# Patient Record
Sex: Male | Born: 1990 | Race: White | Hispanic: No | Smoking: Current some day smoker
Health system: Southern US, Community
[De-identification: ages and names within clinical notes are randomized; demographics above are authoritative.]

## PROBLEM LIST (undated history)

## (undated) DIAGNOSIS — E785 Hyperlipidemia, unspecified: Secondary | ICD-10-CM

## (undated) DIAGNOSIS — F419 Anxiety disorder, unspecified: Secondary | ICD-10-CM

## (undated) DIAGNOSIS — K219 Gastro-esophageal reflux disease without esophagitis: Secondary | ICD-10-CM

## (undated) DIAGNOSIS — I639 Cerebral infarction, unspecified: Secondary | ICD-10-CM

## (undated) HISTORY — PX: NO PAST SURGERIES: SHX2092

---
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
## 2010-04-17 ENCOUNTER — Emergency Department (HOSPITAL_COMMUNITY): Admission: EM | Admit: 2010-04-17 | Discharge: 2010-04-18 | Payer: Self-pay | Admitting: Emergency Medicine

## 2011-06-30 ENCOUNTER — Emergency Department: Payer: Self-pay | Admitting: Internal Medicine

## 2013-09-21 ENCOUNTER — Emergency Department: Payer: Self-pay | Admitting: Emergency Medicine

## 2013-09-21 HISTORY — PX: COLONOSCOPY: SHX174

## 2013-09-21 LAB — TROPONIN I

## 2013-09-21 LAB — COMPREHENSIVE METABOLIC PANEL
ALK PHOS: 92 U/L
ANION GAP: 4 — AB (ref 7–16)
Albumin: 3.7 g/dL (ref 3.4–5.0)
BILIRUBIN TOTAL: 0.5 mg/dL (ref 0.2–1.0)
BUN: 7 mg/dL (ref 7–18)
CALCIUM: 9 mg/dL (ref 8.5–10.1)
CO2: 27 mmol/L (ref 21–32)
CREATININE: 0.77 mg/dL (ref 0.60–1.30)
Chloride: 107 mmol/L (ref 98–107)
Glucose: 110 mg/dL — ABNORMAL HIGH (ref 65–99)
OSMOLALITY: 274 (ref 275–301)
POTASSIUM: 3.7 mmol/L (ref 3.5–5.1)
SGOT(AST): 25 U/L (ref 15–37)
SGPT (ALT): 55 U/L (ref 12–78)
Sodium: 138 mmol/L (ref 136–145)
Total Protein: 7.1 g/dL (ref 6.4–8.2)

## 2013-09-21 LAB — CBC
HCT: 46.6 % (ref 40.0–52.0)
HGB: 16 g/dL (ref 13.0–18.0)
MCH: 28 pg (ref 26.0–34.0)
MCHC: 34.4 g/dL (ref 32.0–36.0)
MCV: 81 fL (ref 80–100)
Platelet: 229 10*3/uL (ref 150–440)
RBC: 5.72 10*6/uL (ref 4.40–5.90)
RDW: 12.7 % (ref 11.5–14.5)
WBC: 11.9 10*3/uL — AB (ref 3.8–10.6)

## 2013-09-21 LAB — LIPASE, BLOOD: Lipase: 78 U/L (ref 73–393)

## 2013-09-23 ENCOUNTER — Emergency Department: Payer: Self-pay | Admitting: Emergency Medicine

## 2013-09-23 LAB — COMPREHENSIVE METABOLIC PANEL
ALBUMIN: 3.9 g/dL (ref 3.4–5.0)
ALK PHOS: 96 U/L
ANION GAP: 2 — AB (ref 7–16)
BUN: 11 mg/dL (ref 7–18)
Bilirubin,Total: 0.3 mg/dL (ref 0.2–1.0)
CO2: 31 mmol/L (ref 21–32)
Calcium, Total: 9.2 mg/dL (ref 8.5–10.1)
Chloride: 105 mmol/L (ref 98–107)
Creatinine: 0.83 mg/dL (ref 0.60–1.30)
EGFR (African American): >60
GLUCOSE: 97 mg/dL (ref 65–99)
OSMOLALITY: 275 (ref 275–301)
POTASSIUM: 4.4 mmol/L (ref 3.5–5.1)
SGOT(AST): 29 U/L (ref 15–37)
SGPT (ALT): 46 U/L (ref 12–78)
Sodium: 138 mmol/L (ref 136–145)
Total Protein: 7.7 g/dL (ref 6.4–8.2)

## 2013-09-23 LAB — CBC WITH DIFFERENTIAL/PLATELET
BASOS ABS: 0.1 10*3/uL (ref 0.0–0.1)
BASOS PCT: 1.3 %
EOS ABS: 0.2 10*3/uL (ref 0.0–0.7)
EOS PCT: 2.3 %
HCT: 47.2 % (ref 40.0–52.0)
HGB: 16.4 g/dL (ref 13.0–18.0)
LYMPHS ABS: 2.3 10*3/uL (ref 1.0–3.6)
LYMPHS PCT: 26.9 %
MCH: 28.4 pg (ref 26.0–34.0)
MCHC: 34.8 g/dL (ref 32.0–36.0)
MCV: 82 fL (ref 80–100)
Monocyte #: 0.9 x10 3/mm (ref 0.2–1.0)
Monocyte %: 10.1 %
NEUTROS ABS: 5.1 10*3/uL (ref 1.4–6.5)
NEUTROS PCT: 59.4 %
PLATELETS: 258 10*3/uL (ref 150–440)
RBC: 5.79 10*6/uL (ref 4.40–5.90)
RDW: 13 % (ref 11.5–14.5)
WBC: 8.6 10*3/uL (ref 3.8–10.6)

## 2013-09-23 LAB — URINALYSIS, COMPLETE
BILIRUBIN, UR: NEGATIVE
BLOOD: NEGATIVE
Bacteria: NONE SEEN
GLUCOSE, UR: NEGATIVE mg/dL (ref 0–75)
Ketone: NEGATIVE
Leukocyte Esterase: NEGATIVE
Nitrite: NEGATIVE
PH: 7 (ref 4.5–8.0)
PROTEIN: NEGATIVE
Specific Gravity: 1.015 (ref 1.003–1.030)
Squamous Epithelial: <1
WBC UR: 1 /HPF (ref 0–5)

## 2013-09-26 ENCOUNTER — Other Ambulatory Visit: Payer: Self-pay | Admitting: Urgent Care

## 2013-09-26 LAB — COMPREHENSIVE METABOLIC PANEL
ANION GAP: 3 — AB (ref 7–16)
Albumin: 4.1 g/dL (ref 3.4–5.0)
Alkaline Phosphatase: 104 U/L
BUN: 14 mg/dL (ref 7–18)
Bilirubin,Total: 0.4 mg/dL (ref 0.2–1.0)
CALCIUM: 9.8 mg/dL (ref 8.5–10.1)
Chloride: 104 mmol/L (ref 98–107)
Co2: 28 mmol/L (ref 21–32)
Creatinine: 0.92 mg/dL (ref 0.60–1.30)
EGFR (Non-African Amer.): >60
GLUCOSE: 87 mg/dL (ref 65–99)
OSMOLALITY: 270 (ref 275–301)
POTASSIUM: 4.3 mmol/L (ref 3.5–5.1)
SGOT(AST): 28 U/L (ref 15–37)
SGPT (ALT): 47 U/L (ref 12–78)
SODIUM: 135 mmol/L — AB (ref 136–145)
TOTAL PROTEIN: 8 g/dL (ref 6.4–8.2)

## 2013-09-26 LAB — CBC WITH DIFFERENTIAL/PLATELET
BASOS ABS: 0.1 10*3/uL (ref 0.0–0.1)
BASOS PCT: 1.3 %
Eosinophil #: 0.1 10*3/uL (ref 0.0–0.7)
Eosinophil %: 1.3 %
HCT: 48.7 % (ref 40.0–52.0)
HGB: 16.6 g/dL (ref 13.0–18.0)
LYMPHS ABS: 1.7 10*3/uL (ref 1.0–3.6)
Lymphocyte %: 21.7 %
MCH: 27.4 pg (ref 26.0–34.0)
MCHC: 34 g/dL (ref 32.0–36.0)
MCV: 81 fL (ref 80–100)
MONO ABS: 0.6 x10 3/mm (ref 0.2–1.0)
Monocyte %: 8 %
NEUTROS ABS: 5.4 10*3/uL (ref 1.4–6.5)
NEUTROS PCT: 67.7 %
Platelet: 274 10*3/uL (ref 150–440)
RBC: 6.05 10*6/uL — AB (ref 4.40–5.90)
RDW: 12.8 % (ref 11.5–14.5)
WBC: 8 10*3/uL (ref 3.8–10.6)

## 2013-09-26 LAB — LIPASE, BLOOD: Lipase: 71 U/L — ABNORMAL LOW (ref 73–393)

## 2014-03-30 ENCOUNTER — Emergency Department: Payer: Self-pay | Admitting: Emergency Medicine

## 2014-03-30 LAB — COMPREHENSIVE METABOLIC PANEL
ALK PHOS: 87 U/L
AST: 30 U/L (ref 15–37)
Albumin: 4.2 g/dL (ref 3.4–5.0)
Anion Gap: 8 (ref 7–16)
BILIRUBIN TOTAL: 0.5 mg/dL (ref 0.2–1.0)
BUN: 15 mg/dL (ref 7–18)
CALCIUM: 8.8 mg/dL (ref 8.5–10.1)
CREATININE: 0.7 mg/dL (ref 0.60–1.30)
Chloride: 110 mmol/L — ABNORMAL HIGH (ref 98–107)
Co2: 25 mmol/L (ref 21–32)
EGFR (African American): >60
GLUCOSE: 117 mg/dL — AB (ref 65–99)
Osmolality: 287 (ref 275–301)
POTASSIUM: 3.9 mmol/L (ref 3.5–5.1)
SGPT (ALT): 35 U/L (ref 12–78)
Sodium: 143 mmol/L (ref 136–145)
TOTAL PROTEIN: 7.3 g/dL (ref 6.4–8.2)

## 2014-03-30 LAB — CBC WITH DIFFERENTIAL/PLATELET
BASOS PCT: 1.1 %
Basophil #: 0.1 10*3/uL (ref 0.0–0.1)
EOS ABS: 0.1 10*3/uL (ref 0.0–0.7)
EOS PCT: 2.1 %
HCT: 46.5 % (ref 40.0–52.0)
HGB: 15.9 g/dL (ref 13.0–18.0)
LYMPHS ABS: 2.1 10*3/uL (ref 1.0–3.6)
Lymphocyte %: 32 %
MCH: 28.3 pg (ref 26.0–34.0)
MCHC: 34.1 g/dL (ref 32.0–36.0)
MCV: 83 fL (ref 80–100)
MONOS PCT: 7.5 %
Monocyte #: 0.5 x10 3/mm (ref 0.2–1.0)
Neutrophil #: 3.8 10*3/uL (ref 1.4–6.5)
Neutrophil %: 57.3 %
Platelet: 222 10*3/uL (ref 150–440)
RBC: 5.61 10*6/uL (ref 4.40–5.90)
RDW: 13 % (ref 11.5–14.5)
WBC: 6.7 10*3/uL (ref 3.8–10.6)

## 2014-03-30 LAB — LIPASE, BLOOD: Lipase: 91 U/L (ref 73–393)

## 2014-03-30 LAB — URINALYSIS, COMPLETE
Bilirubin,UR: NEGATIVE
Blood: NEGATIVE
Glucose,UR: NEGATIVE mg/dL (ref 0–75)
KETONE: NEGATIVE
LEUKOCYTE ESTERASE: NEGATIVE
Nitrite: NEGATIVE
PH: 8 (ref 4.5–8.0)
RBC,UR: <1 /HPF (ref 0–5)
Specific Gravity: 1.025 (ref 1.003–1.030)
Squamous Epithelial: NONE SEEN
WBC UR: <1 /HPF (ref 0–5)

## 2014-04-22 DIAGNOSIS — R109 Unspecified abdominal pain: Secondary | ICD-10-CM | POA: Insufficient documentation

## 2014-04-22 DIAGNOSIS — R197 Diarrhea, unspecified: Secondary | ICD-10-CM | POA: Insufficient documentation

## 2014-06-15 ENCOUNTER — Emergency Department: Payer: Self-pay | Admitting: Emergency Medicine

## 2014-06-28 ENCOUNTER — Emergency Department: Payer: Self-pay | Admitting: Emergency Medicine

## 2015-11-01 ENCOUNTER — Encounter: Payer: Self-pay | Admitting: Emergency Medicine

## 2015-11-01 ENCOUNTER — Emergency Department: Payer: Managed Care, Other (non HMO)

## 2015-11-01 ENCOUNTER — Emergency Department
Admission: EM | Admit: 2015-11-01 | Discharge: 2015-11-01 | Disposition: A | Discharge status: Home / Self Care | Payer: Managed Care, Other (non HMO) | Attending: Student | Admitting: Student

## 2015-11-01 DIAGNOSIS — F17200 Nicotine dependence, unspecified, uncomplicated: Secondary | ICD-10-CM | POA: Diagnosis not present

## 2015-11-01 DIAGNOSIS — M25511 Pain in right shoulder: Secondary | ICD-10-CM | POA: Diagnosis not present

## 2015-11-01 DIAGNOSIS — M25512 Pain in left shoulder: Secondary | ICD-10-CM | POA: Insufficient documentation

## 2015-11-01 DIAGNOSIS — M542 Cervicalgia: Secondary | ICD-10-CM | POA: Insufficient documentation

## 2015-11-01 DIAGNOSIS — R5383 Other fatigue: Secondary | ICD-10-CM | POA: Insufficient documentation

## 2015-11-01 DIAGNOSIS — R079 Chest pain, unspecified: Secondary | ICD-10-CM | POA: Insufficient documentation

## 2015-11-01 DIAGNOSIS — M62838 Other muscle spasm: Secondary | ICD-10-CM | POA: Diagnosis not present

## 2015-11-01 DIAGNOSIS — M546 Pain in thoracic spine: Secondary | ICD-10-CM | POA: Diagnosis present

## 2015-11-01 DIAGNOSIS — M7918 Myalgia, other site: Secondary | ICD-10-CM

## 2015-11-01 DIAGNOSIS — R05 Cough: Secondary | ICD-10-CM | POA: Insufficient documentation

## 2015-11-01 MED ORDER — CYCLOBENZAPRINE HCL 10 MG PO TABS: 10.0000 mg | ORAL_TABLET | Freq: Three times a day (TID) | ORAL | Status: DC | PRN

## 2015-11-01 MED ORDER — IBUPROFEN 600 MG PO TABS: 600.0000 mg | ORAL_TABLET | Freq: Four times a day (QID) | ORAL | Status: DC | PRN

## 2015-11-01 NOTE — ED Notes (Signed)
Reports cough, back and chest pain when coughing.  NAD

## 2015-11-01 NOTE — Discharge Instructions (Signed)
Musculoskeletal Pain °Musculoskeletal pain is muscle and boney aches and pains. These pains can occur in any part of the body. Your caregiver may treat you without knowing the cause of the pain. They may treat you if blood or urine tests, X-rays, and other tests were normal.  °CAUSES °There is often not a definite cause or reason for these pains. These pains may be caused by a type of germ (virus). The discomfort may also come from overuse. Overuse includes working out too hard when your body is not fit. Boney aches also come from weather changes. Bone is sensitive to atmospheric pressure changes. °HOME CARE INSTRUCTIONS  °· Ask when your test results will be ready. Make sure you get your test results. °· Only take over-the-counter or prescription medicines for pain, discomfort, or fever as directed by your caregiver. If you were given medications for your condition, do not drive, operate machinery or power tools, or sign legal documents for 24 hours. Do not drink alcohol. Do not take sleeping pills or other medications that may interfere with treatment. °· Continue all activities unless the activities cause more pain. When the pain lessens, slowly resume normal activities. Gradually increase the intensity and duration of the activities or exercise. °· During periods of severe pain, bed rest may be helpful. Lay or sit in any position that is comfortable. °· Putting ice on the injured area. °¨ Put ice in a bag. °¨ Place a towel between your skin and the bag. °¨ Leave the ice on for 15 to 20 minutes, 3 to 4 times a day. °· Follow up with your caregiver for continued problems and no reason can be found for the pain. If the pain becomes worse or does not go away, it may be necessary to repeat tests or do additional testing. Your caregiver may need to look further for a possible cause. °SEEK IMMEDIATE MEDICAL CARE IF: °· You have pain that is getting worse and is not relieved by medications. °· You develop chest pain  that is associated with shortness or breath, sweating, feeling sick to your stomach (nauseous), or throw up (vomit). °· Your pain becomes localized to the abdomen. °· You develop any new symptoms that seem different or that concern you. °MAKE SURE YOU:  °· Understand these instructions. °· Will watch your condition. °· Will get help right away if you are not doing well or get worse. °  °This information is not intended to replace advice given to you by your health care provider. Make sure you discuss any questions you have with your health care provider. °  °Document Released: 09/07/2005 Document Revised: 11/30/2011 Document Reviewed: 05/12/2013 °Elsevier Interactive Patient Education ©2016 [employer] ° °Muscle Cramps and Spasms °Muscle cramps and spasms are when muscles tighten by themselves. They usually get better within minutes. Muscle cramps are painful. They are usually stronger and last longer than muscle spasms. Muscle spasms may or may not be painful. They can last a few seconds or much longer. °HOME CARE °· Drink enough fluid to keep your pee (urine) clear or pale yellow. °· Massage, stretch, and relax the muscle. °· Use a warm towel, heating pad, or warm shower water on tight muscles. °· Place ice on the muscle if it is tender or in pain. °¨ Put ice in a plastic bag. °¨ Place a towel between your skin and the bag. °¨ Leave the ice on for 15-20 minutes, 03-04 times a day. °· Only take medicine as told by your   doctor. °GET HELP RIGHT AWAY IF:  °Your cramps or spasms get worse, happen more often, or do not get better with time. °MAKE SURE YOU: °· Understand these instructions. °· Will watch your condition. °· Will get help right away if you are not doing well or get worse. °  °This information is not intended to replace advice given to you by your health care provider. Make sure you discuss any questions you have with your health care provider. °  °Document Released: 08/20/2008 Document Revised:  01/02/2013 Document Reviewed: 08/24/2012 °Elsevier Interactive Patient Education ©2016 Elsevier Inc. ° °

## 2015-11-01 NOTE — ED Provider Notes (Signed)
[facility] Emergency Department Provider Note  ____________________________________________  Time seen: Approximately 11:08 AM  I have reviewed the triage vital signs and the nursing notes.   HISTORY  Chief Complaint Back Pain    HPI Luke Conway. is a 25 y.o. male , NAD, presents to emergency department with one-day history of upper back pain and chest pain. Notes he is a chronic smoker and coughs daily. Has had some fatigue recently. Denies shortness of breath or wheezing. No fevers, chills, myalgias. No known sick contacts. No changes in physical activity.   History reviewed. No pertinent past medical history.  There are no active problems to display for this patient.   History reviewed. No pertinent past surgical history.  Current Outpatient Rx  Name  Route  Sig  Dispense  Refill  . cyclobenzaprine (FLEXERIL) 10 MG tablet   Oral   Take 1 tablet (10 mg total) by mouth 3 (three) times daily as needed for muscle spasms.   21 tablet   0   . ibuprofen (ADVIL,MOTRIN) 600 MG tablet   Oral   Take 1 tablet (600 mg total) by mouth every 6 (six) hours as needed.   30 tablet   0     Allergies Review of patient's allergies indicates no known allergies.  History reviewed. No pertinent family history.  Social History Social History  Substance Use Topics  . Smoking status: Current Every Day Smoker  . Smokeless tobacco: None  . Alcohol Use: None     Review of Systems  Constitutional: Positive fatigue. No fever/chills Eyes: No visual changes.  ENT: No sore throat, nasal congestion, ear pain. Cardiovascular: Positive chest pain. Respiratory: No cough. No shortness of breath. No wheezing.  Gastrointestinal: No abdominal pain.  No nausea, vomiting.   Musculoskeletal: Positive for neck and shoulder pain.   Skin: Negative for rash. Neurological: Negative for headaches, focal weakness or numbness. 10-point ROS otherwise  negative.  ____________________________________________   PHYSICAL EXAM:  VITAL SIGNS: ED Triage Vitals  Enc Vitals Group     BP 11/01/15 1021 107/94 mmHg     Pulse Rate 11/01/15 1021 62     Resp 11/01/15 1021 20     Temp 11/01/15 1021 97.4 F (36.3 C)     Temp Source 11/01/15 1021 Oral     SpO2 11/01/15 1021 99 %     Weight 11/01/15 1021 190 lb (86.183 kg)     Height 11/01/15 1021  (1.727 m)     Head Cir --      Peak Flow --      Pain Score 11/01/15 1021 4     Pain Loc --      Pain Edu? --      Excl. in GC? --     Constitutional: Alert and oriented. Well appearing and in no acute distress. Eyes: Conjunctivae are normal. PERRL. EOMI without pain.  Head: Atraumatic. ENT:      Ears: TMs and external ear canals visualized bilaterally without any gross abnormality.      Nose: No congestion/rhinnorhea.      Mouth/Throat: Mucous membranes are moist. No erythema, swelling, exudates Neck: Midline and lateral cervical spinal tenderness with palpation. Supple with full range of motion. Hematological/Lymphatic/Immunilogical: No cervical lymphadenopathy. Cardiovascular: Normal rate, regular rhythm. Normal S1 and S2.  Good peripheral circulation. Respiratory: Normal respiratory effort without tachypnea or retractions. Lungs CTAB. Musculoskeletal: Reproducible pain to palpation over the central chest. Pain to palpation over the trapezius bilaterally.  FROM of bilateral shoulders with some pain. No lower extremity tenderness nor edema.  No joint effusions. Neurologic:  Normal speech and language. No gross focal neurologic deficits are appreciated.  Skin:  Skin is warm, dry and intact. No rash noted. Psychiatric: Mood and affect are normal. Speech and behavior are normal. Patient exhibits appropriate insight and judgement.   ____________________________________________   LABS  None  ____________________________________________  EKG  No acute changes, no STEMI. EKG also  reviewed by Dr. Toney Rakes.  ____________________________________________  RADIOLOGY I have personally viewed and evaluated these images (plain radiographs) as part of my medical decision making, as well as reviewing the written report by the radiologist.  Dg Chest 2 View  11/01/2015  CLINICAL DATA:  Left-sided chest pain and shortness of breath for 1 day, patient smokes EXAM: CHEST  2 VIEW COMPARISON:  06/30/2011 FINDINGS: The heart size and mediastinal contours are within normal limits. Both lungs are clear. The visualized skeletal structures are unremarkable. IMPRESSION: No active cardiopulmonary disease. Electronically Signed   By: Esperanza Heir M.D.   On: 11/01/2015 11:53    ____________________________________________    PROCEDURES  Procedure(s) performed: None    Medications - No data to display   ____________________________________________   INITIAL IMPRESSION / ASSESSMENT AND PLAN / ED COURSE  Pertinent imaging results that were available during my care of the patient were reviewed by me and considered in my medical decision making (see chart for details).  Patient's diagnosis is consistent with skeletal skeletal pain and muscle spasm. Patient will be discharged home with prescriptions for ibuprofen 600 mg take one tablet by mouth 3-4 times daily as needed for pain and inflammation as well as cyclobenzaprine 10 mg 1 tablet by mouth every 8 hours as needed for muscle spasm. Patient is to follow up with [facility] if symptoms persist past this treatment course. Patient is given ED precautions to return to the ED for any worsening or new symptoms.    ____________________________________________  FINAL CLINICAL IMPRESSION(S) / ED DIAGNOSES  Final diagnoses:  Muscle spasm  Musculoskeletal pain      NEW MEDICATIONS STARTED DURING THIS VISIT:  New Prescriptions   CYCLOBENZAPRINE (FLEXERIL) 10 MG TABLET    Take 1 tablet (10 mg total) by mouth 3 (three)  times daily as needed for muscle spasms.   IBUPROFEN (ADVIL,MOTRIN) 600 MG TABLET    Take 1 tablet (600 mg total) by mouth every 6 (six) hours as needed.         Hope Pigeon, PA-C 11/01/15 1202  Gayla Doss, MD 11/01/15 [phone]

## 2016-12-25 ENCOUNTER — Emergency Department
Admission: EM | Admit: 2016-12-25 | Discharge: 2016-12-25 | Disposition: A | Discharge status: Home / Self Care | Payer: Managed Care, Other (non HMO) | Attending: Emergency Medicine | Admitting: Emergency Medicine

## 2016-12-25 ENCOUNTER — Emergency Department: Payer: Managed Care, Other (non HMO)

## 2016-12-25 ENCOUNTER — Encounter: Payer: Self-pay | Admitting: Emergency Medicine

## 2016-12-25 DIAGNOSIS — R509 Fever, unspecified: Secondary | ICD-10-CM | POA: Diagnosis present

## 2016-12-25 DIAGNOSIS — B349 Viral infection, unspecified: Secondary | ICD-10-CM | POA: Insufficient documentation

## 2016-12-25 DIAGNOSIS — R109 Unspecified abdominal pain: Secondary | ICD-10-CM

## 2016-12-25 LAB — COMPREHENSIVE METABOLIC PANEL
ALBUMIN: 4.4 g/dL (ref 3.5–5.0)
ALT: 41 U/L (ref 17–63)
AST: 27 U/L (ref 15–41)
Alkaline Phosphatase: 64 U/L (ref 38–126)
Anion gap: 7 (ref 5–15)
BUN: 9 mg/dL (ref 6–20)
CHLORIDE: 106 mmol/L (ref 101–111)
CO2: 23 mmol/L (ref 22–32)
Calcium: 9.1 mg/dL (ref 8.9–10.3)
Creatinine, Ser: 0.83 mg/dL (ref 0.61–1.24)
GFR calc Af Amer: >60 mL/min (ref >60)
GFR calc non Af Amer: >60 mL/min (ref >60)
GLUCOSE: 113 mg/dL — AB (ref 65–99)
POTASSIUM: 3.8 mmol/L (ref 3.5–5.1)
Sodium: 136 mmol/L (ref 135–145)
Total Bilirubin: 0.9 mg/dL (ref 0.3–1.2)
Total Protein: 7.4 g/dL (ref 6.5–8.1)

## 2016-12-25 LAB — CBC WITH DIFFERENTIAL/PLATELET
Basophils Absolute: 0.1 10*3/uL (ref 0–0.1)
Basophils Relative: 0 %
EOS PCT: 0 %
Eosinophils Absolute: 0 10*3/uL (ref 0–0.7)
HCT: 45.8 % (ref 40.0–52.0)
Hemoglobin: 15.6 g/dL (ref 13.0–18.0)
LYMPHS ABS: 1.2 10*3/uL (ref 1.0–3.6)
LYMPHS PCT: 7 %
MCH: 27.6 pg (ref 26.0–34.0)
MCHC: 34 g/dL (ref 32.0–36.0)
MCV: 81.3 fL (ref 80.0–100.0)
MONO ABS: 1.6 10*3/uL — AB (ref 0.2–1.0)
MONOS PCT: 9 %
Neutro Abs: 13.9 10*3/uL — ABNORMAL HIGH (ref 1.4–6.5)
Neutrophils Relative %: 84 %
PLATELETS: 210 10*3/uL (ref 150–440)
RBC: 5.63 MIL/uL (ref 4.40–5.90)
RDW: 13.1 % (ref 11.5–14.5)
WBC: 16.8 10*3/uL — ABNORMAL HIGH (ref 3.8–10.6)

## 2016-12-25 LAB — URINALYSIS, COMPLETE (UACMP) WITH MICROSCOPIC
Bacteria, UA: NONE SEEN
Bilirubin Urine: NEGATIVE
Glucose, UA: NEGATIVE mg/dL
Hgb urine dipstick: NEGATIVE
KETONES UR: NEGATIVE mg/dL
Leukocytes, UA: NEGATIVE
Nitrite: NEGATIVE
PH: 6 (ref 5.0–8.0)
PROTEIN: 30 mg/dL — AB
Specific Gravity, Urine: 1.036 — ABNORMAL HIGH (ref 1.005–1.030)

## 2016-12-25 LAB — CHLAMYDIA/NGC RT PCR (ARMC ONLY)
Chlamydia Tr: NOT DETECTED
N gonorrhoeae: NOT DETECTED

## 2016-12-25 MED ORDER — CIPROFLOXACIN IN D5W 400 MG/200ML IV SOLN
400.0000 mg | Freq: Once | INTRAVENOUS | Status: AC
  Administered 2016-12-25: 400 mg via INTRAVENOUS
  Filled 2016-12-25: qty 200

## 2016-12-25 MED ORDER — SODIUM CHLORIDE 0.9 % IV BOLUS (SEPSIS)
1000.0000 mL | Freq: Once | INTRAVENOUS | Status: AC
  Administered 2016-12-25: 1000 mL via INTRAVENOUS

## 2016-12-25 MED ORDER — PSEUDOEPH-BROMPHEN-DM 30-2-10 MG/5ML PO SYRP: 10.0000 mL | ORAL_SOLUTION | Freq: Four times a day (QID) | ORAL | 0 refills | Status: DC | PRN

## 2016-12-25 MED ORDER — MAGIC MOUTHWASH W/LIDOCAINE: 5.0000 mL | Freq: Four times a day (QID) | ORAL | 0 refills | Status: DC

## 2016-12-25 MED ORDER — CIPROFLOXACIN HCL 500 MG PO TABS: 500.0000 mg | ORAL_TABLET | Freq: Two times a day (BID) | ORAL | 0 refills | Status: DC

## 2016-12-25 MED ORDER — ACETAMINOPHEN 500 MG PO TABS
1000.0000 mg | ORAL_TABLET | Freq: Once | ORAL | Status: AC
  Administered 2016-12-25: 1000 mg via ORAL
  Filled 2016-12-25: qty 2

## 2016-12-25 NOTE — ED Triage Notes (Signed)
Pt states cough, fever, sore throat and back pain since last night. Pt states last tylenol at 1400 today, no motrin at home. Pt is drinking water in triage. Pt states he has dark orange colored urine as well. Mask applied in triage.

## 2016-12-25 NOTE — ED Provider Notes (Signed)
[facility] Emergency Department Provider Note  ____________________________________________  Time seen: Approximately 8:25 PM  I have reviewed the triage vital signs and the nursing notes.   HISTORY  Chief Complaint Fever    HPI Luke Conway. is a 26 y.o. male who presents to emergency room complaining of fevers, cough, sore throat, lower back pain, dysuria, changes in color of urine. Symptoms and ongoing 2 days. Patient denies any visual changes, neck pain or stiffness, chest pain, shortness of breath, abdominal pain, nausea or vomiting. Patient does not have a history of kidney stone denies hematuria but states that multiple family members have had kidney stones. Patient has been taking Tylenol or home, last dose was 6-1/2 hours prior to arrival in the emergency department. No other complaints at this time.   History reviewed. No pertinent past medical history.  There are no active problems to display for this patient.   History reviewed. No pertinent surgical history.  Prior to Admission medications   Medication Sig Start Date End Date Taking? Authorizing Provider  brompheniramine-pseudoephedrine-DM 30-2-10 MG/5ML syrup Take 10 mLs by mouth 4 (four) times daily as needed. 12/25/16   Delorise Royals Novalynn Branaman, PA-C  ciprofloxacin (CIPRO) 500 MG tablet Take 1 tablet (500 mg total) by mouth 2 (two) times daily. 12/25/16   Delorise Royals Jaquail Mclees, PA-C  cyclobenzaprine (FLEXERIL) 10 MG tablet Take 1 tablet (10 mg total) by mouth 3 (three) times daily as needed for muscle spasms. 11/01/15   Jami L Hagler, PA-C  ibuprofen (ADVIL,MOTRIN) 600 MG tablet Take 1 tablet (600 mg total) by mouth every 6 (six) hours as needed. 11/01/15   Jami L Hagler, PA-C  magic mouthwash w/lidocaine SOLN Take 5 mLs by mouth 4 (four) times daily. 12/25/16   Delorise Royals Aubrie Lucien, PA-C    Allergies Patient has no known allergies.  History reviewed. No pertinent family history.  Social  History Social History  Substance Use Topics  . Smoking status: Current Every Day Smoker  . Smokeless tobacco: Current User  . Alcohol use No     Review of Systems  Constitutional: Positive fever/chills Eyes: No visual changes. No discharge ENT: Positive for sore throat. Cardiovascular: no chest pain. Respiratory: no cough. No SOB. Gastrointestinal: No abdominal pain.  No nausea, no vomiting.  No diarrhea.  No constipation. Genitourinary: Positive for dysuria. No hematuria. Changes in urine color. Left flank pain. Musculoskeletal: Negative for musculoskeletal pain. Skin: Negative for rash, abrasions, lacerations, ecchymosis. Neurological: Negative for headaches, focal weakness or numbness. 10-point ROS otherwise negative.  ____________________________________________   PHYSICAL EXAM:  VITAL SIGNS: ED Triage Vitals  Enc Vitals Group     BP 12/25/16 2008 122/72     Pulse Rate 12/25/16 2008 90     Resp 12/25/16 2008 16     Temp 12/25/16 2008 (!) 101.4 F (38.6 C)     Temp Source 12/25/16 2008 Oral     SpO2 12/25/16 2008 96 %     Weight 12/25/16 2009 160 lb (72.6 kg)     Height 12/25/16 2009  (1.702 m)     Head Circumference --      Peak Flow --      Pain Score 12/25/16 2008 7     Pain Loc --      Pain Edu? --      Excl. in GC? --      Constitutional: Alert and oriented. Well appearing and in no acute distress. Eyes: Conjunctivae are normal. PERRL. EOMI.  Head: Atraumatic. ENT:      Ears:       Nose: No congestion/rhinnorhea.      Mouth/Throat: Mucous membranes are moist. Oropharynx is mildly erythematous but nonedematous. Uvula is midline. Neck: No stridor.   Hematological/Lymphatic/Immunilogical: No cervical lymphadenopathy. Cardiovascular: Normal rate, regular rhythm. Normal S1 and S2.  Good peripheral circulation. Respiratory: Normal respiratory effort without tachypnea or retractions. Lungs CTAB. Good air entry to the bases with no decreased or absent  breath sounds. Gastrointestinal: Bowel sounds 4 quadrants. Soft and nontender to palpation. No guarding or rigidity. No palpable masses. No distention. Positive for left-sided CVA tenderness. Musculoskeletal: Full range of motion to all extremities. No gross deformities appreciated. Neurologic:  Normal speech and language. No gross focal neurologic deficits are appreciated.  Skin:  Skin is warm, dry and intact. No rash noted. Psychiatric: Mood and affect are normal. Speech and behavior are normal. Patient exhibits appropriate insight and judgement.   ____________________________________________   LABS (all labs ordered are listed, but only abnormal results are displayed)  Labs Reviewed  URINALYSIS, COMPLETE (UACMP) WITH MICROSCOPIC - Abnormal; Notable for the following:       Result Value   Color, Urine AMBER (*)    APPearance HAZY (*)    Specific Gravity, Urine 1.036 (*)    Protein, ur 30 (*)    Squamous Epithelial / LPF 0-5 (*)    All other components within normal limits  COMPREHENSIVE METABOLIC PANEL - Abnormal; Notable for the following:    Glucose, Bld 113 (*)    All other components within normal limits  CBC WITH DIFFERENTIAL/PLATELET - Abnormal; Notable for the following:    WBC 16.8 (*)    Neutro Abs 13.9 (*)    Monocytes Absolute 1.6 (*)    All other components within normal limits  CHLAMYDIA/NGC RT PCR ([facility] ONLY)  URINE CULTURE   ____________________________________________  EKG   ____________________________________________  RADIOLOGY Festus Barren Sherrilynn Gudgel, personally viewed and evaluated these images (plain radiographs) as part of my medical decision making, as well as reviewing the written report by the radiologist.  Dg Abdomen 1 View  Result Date: 12/25/2016 CLINICAL DATA:  Left flank pain and dark urine. EXAM: ABDOMEN - 1 VIEW COMPARISON:  03/30/2014 FINDINGS: Bowel gas pattern is nonobstructive. No abdominal mass or mass effect. No abnormal  calcifications over the abdomen. No free peritoneal air. Sclerotic focus over the right iliac bone unchanged likely a bone island. IMPRESSION: Nonobstructive bowel gas pattern. Electronically Signed   By: Elberta Fortis M.D.   On: 12/25/2016 21:05    ____________________________________________    PROCEDURES  Procedure(s) performed:    Procedures    Medications  ciprofloxacin (CIPRO) IVPB 400 mg (400 mg Intravenous New Bag/Given 12/25/16 2144)  acetaminophen (TYLENOL) tablet 1,000 mg (1,000 mg Oral Given 12/25/16 2021)  sodium chloride 0.9 % bolus 1,000 mL (1,000 mLs Intravenous New Bag/Given 12/25/16 2144)     ____________________________________________   INITIAL IMPRESSION / ASSESSMENT AND PLAN / ED COURSE  Pertinent labs & imaging results that were available during my care of the patient were reviewed by me and considered in my medical decision making (see chart for details).  Review of the [place] CSRS was performed in accordance of the [facility] prior to dispensing any controlled drugs.     Patient's diagnosis is consistent with Fever, viral respiratory infection, flank pain. Upper respiratory symptoms are consistent with a viral infection. Negative strep test and negative on Centor criteria. Patient's other symptoms are consistent  with pyelonephritis with flank pain, with physical exam findings of CVA tenderness, urine discoloration. Urinalysis returned without any indication of UTI with no nitrates, leukocytes, bacteria. However, patient will be placed on antibiotics as a precaution. Patient's other labs and imaging returned with reassuring results. Elevated white blood cell count is consistent with either viral or bacterial infection. At this time, no indication for hospitalization. Patient is maintaining good oral intake of solids and liquids and is able to take oral antibiotics. Patient is given fluids and IV antibiotics emergency department prior to discharge. Specific instructions are  provided to the patient to return for any worsening of symptoms..  Patient is to follow up with primary care as needed or otherwise directed. Patient is given ED precautions to return to the ED for any worsening or new symptoms.     ____________________________________________  FINAL CLINICAL IMPRESSION(S) / ED DIAGNOSES  Final diagnoses:  Viral illness  Left flank pain      NEW MEDICATIONS STARTED DURING THIS VISIT:  New Prescriptions   BROMPHENIRAMINE-PSEUDOEPHEDRINE-DM 30-2-10 MG/5ML SYRUP    Take 10 mLs by mouth 4 (four) times daily as needed.   CIPROFLOXACIN (CIPRO) 500 MG TABLET    Take 1 tablet (500 mg total) by mouth 2 (two) times daily.   MAGIC MOUTHWASH W/LIDOCAINE SOLN    Take 5 mLs by mouth 4 (four) times daily.        This chart was dictated using voice recognition software/Dragon. Despite best efforts to proofread, errors can occur which can change the meaning. Any change was purely unintentional.    Racheal Patches, PA-C 12/25/16 2226    Phineas Semen, MD 12/25/16 2250

## 2016-12-27 LAB — URINE CULTURE: SPECIAL REQUESTS: NORMAL

## 2017-04-17 ENCOUNTER — Emergency Department
Admission: EM | Admit: 2017-04-17 | Discharge: 2017-04-17 | Disposition: A | Discharge status: Home / Self Care | Payer: Managed Care, Other (non HMO) | Attending: Emergency Medicine | Admitting: Emergency Medicine

## 2017-04-17 ENCOUNTER — Encounter: Payer: Self-pay | Admitting: Emergency Medicine

## 2017-04-17 DIAGNOSIS — J069 Acute upper respiratory infection, unspecified: Secondary | ICD-10-CM | POA: Insufficient documentation

## 2017-04-17 DIAGNOSIS — B9789 Other viral agents as the cause of diseases classified elsewhere: Secondary | ICD-10-CM

## 2017-04-17 DIAGNOSIS — F17290 Nicotine dependence, other tobacco product, uncomplicated: Secondary | ICD-10-CM | POA: Insufficient documentation

## 2017-04-17 DIAGNOSIS — F17210 Nicotine dependence, cigarettes, uncomplicated: Secondary | ICD-10-CM | POA: Insufficient documentation

## 2017-04-17 HISTORY — DX: Gastro-esophageal reflux disease without esophagitis: K21.9

## 2017-04-17 MED ORDER — PSEUDOEPH-BROMPHEN-DM 30-2-10 MG/5ML PO SYRP: 10.0000 mL | ORAL_SOLUTION | Freq: Four times a day (QID) | ORAL | 0 refills | Status: DC | PRN

## 2017-04-17 NOTE — ED Provider Notes (Signed)
[facility] Emergency Department Provider Note  ____________________________________________   First MD Initiated Contact with Patient 04/17/17 [phone]     (approximate)  I have reviewed the triage vital signs and the nursing notes.   HISTORY  Chief Complaint Sore Throat    HPI Luke ChurchKenneth D Cherian Jr. is a 26 y.o. male is here with complaint of sore throat and body aches and chest today. Patient states he had a temperature of 101 last evening. He denies any cough, earache, nausea or vomiting. He is not taking any medication for his sore throat. Patient is a current smoker. Patient rates his throat pain as 7 out of 10.   Past Medical History:  Diagnosis Date  . GERD (gastroesophageal reflux disease)     There are no active problems to display for this patient.   History reviewed. No pertinent surgical history.  Prior to Admission medications   Medication Sig Start Date End Date Taking? Authorizing Provider  brompheniramine-pseudoephedrine-DM 30-2-10 MG/5ML syrup Take 10 mLs by mouth 4 (four) times daily as needed. 04/17/17   Tommi RumpsSummers, Christos Mixson L, PA-C    Allergies Patient has no known allergies.  No family history on file.  Social History Social History  Substance Use Topics  . Smoking status: Current Every Day Smoker    Packs/day: 0.50    Types: Cigarettes  . Smokeless tobacco: Current User  . Alcohol use No    Review of Systems  Constitutional: Positive fever/chills Eyes: No visual changes. ENT: Positive sore throat. Negative for ear pain. Cardiovascular: Denies chest pain. Respiratory: Denies shortness of breath. Gastrointestinal: No abdominal pain.  No nausea, no vomiting.  No diarrhea.   Musculoskeletal: Positive for body aches times one day. Skin: Negative for rash. Neurological: Negative for headaches, focal weakness or numbness.   ____________________________________________   PHYSICAL EXAM:  VITAL SIGNS: ED Triage Vitals    Enc Vitals Group     BP 04/17/17 0808 117/69     Pulse Rate 04/17/17 0808 94     Resp 04/17/17 0808 18     Temp 04/17/17 0808 98 F (36.7 C)     Temp Source 04/17/17 0808 Oral     SpO2 04/17/17 0808 98 %     Weight 04/17/17 0809 170 lb (77.1 kg)     Height 04/17/17 0809 5\' 7"  (1.702 m)     Head Circumference --      Peak Flow --      Pain Score 04/17/17 0808 7     Pain Loc --      Pain Edu? --      Excl. in GC? --    Constitutional: Alert and oriented. Well appearing and in no acute distress. Eyes: Conjunctivae are normal. PERRL. EOMI. Head: Atraumatic. Nose: No congestion/rhinnorhea. EACs and TMs are clear bilaterally. Mouth/Throat: Mucous membranes are moist.  Oropharynx non-erythematous.Minimal posterior drainage. No exudate present. Neck: No stridor.   Hematological/Lymphatic/Immunilogical: No cervical lymphadenopathy. Cardiovascular: Normal rate, regular rhythm. Grossly normal heart sounds.  Good peripheral circulation. Respiratory: Normal respiratory effort.  No retractions. Lungs CTAB. Gastrointestinal: Soft and nontender. No distention.  No CVA tenderness. Musculoskeletal: Moves upper and lower extremities without any difficulty. Normal gait was noted. Neurologic:  Normal speech and language. No gross focal neurologic deficits are appreciated.  Skin:  Skin is warm, dry and intact. No rash noted. Psychiatric: Mood and affect are normal. Speech and behavior are normal.  ____________________________________________   LABS (all labs ordered are listed, but only abnormal results  are displayed)  Labs Reviewed - No data to display   PROCEDURES  Procedure(s) performed: None  Procedures  Critical Care performed: No  ____________________________________________   INITIAL IMPRESSION / ASSESSMENT AND PLAN / ED COURSE  Pertinent labs & imaging results that were available during my care of the patient were reviewed by me and considered in my medical decision making  (see chart for details).  Patient is continued drinking lots of fluids and take Tylenol or ibuprofen if needed for fever. He is encouraged to discontinue or reduce the amount of smoking that he is doing. He is given a prescription for cough and congestion. He was given a note to remain out of work today. [facility] any continued problems.    ___________________________________________   FINAL CLINICAL IMPRESSION(S) / ED DIAGNOSES  Final diagnoses:  Viral URI with cough      NEW MEDICATIONS STARTED DURING THIS VISIT:  Discharge Medication List as of 04/17/2017  8:29 AM       Note:  This document was prepared using Dragon voice recognition software and may include unintentional dictation errors.    Tommi RumpsSummers, Pal Shell L, PA-C 04/17/17 1357    Nita SickleVeronese, [place], MD 04/17/17 1440

## 2017-04-17 NOTE — Discharge Instructions (Signed)
Increase fluids. Decrease smoking. Begin taking cough medication only as directed. [facility] if any continued problems. Information is listed on your discharge papers for location and phone number.

## 2017-04-17 NOTE — ED Triage Notes (Signed)
Sore throat and boday aches since yesterday. Fever 101F at night.

## 2017-04-17 NOTE — ED Notes (Signed)
Pt verbalized understanding of discharge instructions. NAD at this time. 

## 2017-06-14 ENCOUNTER — Encounter: Payer: Self-pay | Admitting: Emergency Medicine

## 2017-06-14 ENCOUNTER — Emergency Department: Payer: Self-pay

## 2017-06-14 ENCOUNTER — Emergency Department
Admission: EM | Admit: 2017-06-14 | Discharge: 2017-06-14 | Disposition: A | Discharge status: Home / Self Care | Payer: Self-pay | Attending: Emergency Medicine | Admitting: Emergency Medicine

## 2017-06-14 DIAGNOSIS — Y939 Activity, unspecified: Secondary | ICD-10-CM | POA: Insufficient documentation

## 2017-06-14 DIAGNOSIS — R110 Nausea: Secondary | ICD-10-CM | POA: Insufficient documentation

## 2017-06-14 DIAGNOSIS — Y33XXXA Other specified events, undetermined intent, initial encounter: Secondary | ICD-10-CM | POA: Insufficient documentation

## 2017-06-14 DIAGNOSIS — Y929 Unspecified place or not applicable: Secondary | ICD-10-CM | POA: Insufficient documentation

## 2017-06-14 DIAGNOSIS — F17210 Nicotine dependence, cigarettes, uncomplicated: Secondary | ICD-10-CM | POA: Insufficient documentation

## 2017-06-14 DIAGNOSIS — T148XXA Other injury of unspecified body region, initial encounter: Secondary | ICD-10-CM

## 2017-06-14 DIAGNOSIS — Y999 Unspecified external cause status: Secondary | ICD-10-CM | POA: Insufficient documentation

## 2017-06-14 DIAGNOSIS — M62838 Other muscle spasm: Secondary | ICD-10-CM

## 2017-06-14 LAB — COMPREHENSIVE METABOLIC PANEL
ALBUMIN: 4.7 g/dL (ref 3.5–5.0)
ALT: 45 U/L (ref 17–63)
AST: 26 U/L (ref 15–41)
Alkaline Phosphatase: 71 U/L (ref 38–126)
Anion gap: 7 (ref 5–15)
BILIRUBIN TOTAL: 0.5 mg/dL (ref 0.3–1.2)
BUN: 11 mg/dL (ref 6–20)
CHLORIDE: 110 mmol/L (ref 101–111)
CO2: 24 mmol/L (ref 22–32)
Calcium: 9 mg/dL (ref 8.9–10.3)
Creatinine, Ser: 0.77 mg/dL (ref 0.61–1.24)
Glucose, Bld: 86 mg/dL (ref 65–99)
POTASSIUM: 3.8 mmol/L (ref 3.5–5.1)
Sodium: 141 mmol/L (ref 135–145)
Total Protein: 7.4 g/dL (ref 6.5–8.1)

## 2017-06-14 LAB — CBC
HEMATOCRIT: 46.8 % (ref 40.0–52.0)
Hemoglobin: 16.2 g/dL (ref 13.0–18.0)
MCH: 28.1 pg (ref 26.0–34.0)
MCHC: 34.6 g/dL (ref 32.0–36.0)
MCV: 81.2 fL (ref 80.0–100.0)
PLATELETS: 244 10*3/uL (ref 150–440)
RBC: 5.77 MIL/uL (ref 4.40–5.90)
RDW: 13.7 % (ref 11.5–14.5)
WBC: 11.1 10*3/uL — AB (ref 3.8–10.6)

## 2017-06-14 LAB — URINALYSIS, COMPLETE (UACMP) WITH MICROSCOPIC
BACTERIA UA: NONE SEEN
BILIRUBIN URINE: NEGATIVE
GLUCOSE, UA: NEGATIVE mg/dL
HGB URINE DIPSTICK: NEGATIVE
Ketones, ur: NEGATIVE mg/dL
LEUKOCYTES UA: NEGATIVE
NITRITE: NEGATIVE
Protein, ur: NEGATIVE mg/dL
RBC / HPF: NONE SEEN RBC/hpf (ref 0–5)
SPECIFIC GRAVITY, URINE: 1.014 (ref 1.005–1.030)
pH: 7 (ref 5.0–8.0)

## 2017-06-14 MED ORDER — HYDROCODONE-ACETAMINOPHEN 5-325 MG PO TABS
1.0000 | ORAL_TABLET | Freq: Once | ORAL | Status: AC
  Administered 2017-06-14: 1 via ORAL
  Filled 2017-06-14: qty 1

## 2017-06-14 MED ORDER — IOPAMIDOL (ISOVUE-300) INJECTION 61%
100.0000 mL | Freq: Once | INTRAVENOUS | Status: AC | PRN
  Administered 2017-06-14: 100 mL via INTRAVENOUS

## 2017-06-14 MED ORDER — IBUPROFEN 600 MG PO TABS
600.0000 mg | ORAL_TABLET | Freq: Once | ORAL | Status: AC
  Administered 2017-06-14: 600 mg via ORAL
  Filled 2017-06-14: qty 1

## 2017-06-14 MED ORDER — HYDROCODONE-ACETAMINOPHEN 5-325 MG PO TABS: 1.0000 | ORAL_TABLET | Freq: Four times a day (QID) | ORAL | 0 refills | Status: DC | PRN

## 2017-06-14 MED ORDER — MORPHINE SULFATE (PF) 4 MG/ML IV SOLN
4.0000 mg | Freq: Once | INTRAVENOUS | Status: AC
  Administered 2017-06-14: 4 mg via INTRAVENOUS
  Filled 2017-06-14: qty 1

## 2017-06-14 MED ORDER — IBUPROFEN 600 MG PO TABS: 600.0000 mg | ORAL_TABLET | Freq: Three times a day (TID) | ORAL | 0 refills | Status: DC | PRN

## 2017-06-14 MED ORDER — ONDANSETRON HCL 4 MG/2ML IJ SOLN
4.0000 mg | Freq: Once | INTRAMUSCULAR | Status: AC
  Administered 2017-06-14: 4 mg via INTRAVENOUS
  Filled 2017-06-14: qty 2

## 2017-06-14 MED ORDER — SODIUM CHLORIDE 0.9 % IV BOLUS (SEPSIS)
1000.0000 mL | Freq: Once | INTRAVENOUS | Status: AC
  Administered 2017-06-14: 1000 mL via INTRAVENOUS

## 2017-06-14 NOTE — ED Triage Notes (Signed)
States R flank pain x 2 days. denies dysuria.

## 2017-06-14 NOTE — Discharge Instructions (Signed)
Fortunately today your blood work, urinalysis, urinalysis, and your CT scan were very normal. Please take your pain medication as needed for severe symptoms and establish care with a primary care physician within the next week for recheck. Return to the emergency department sooner for any concerns.  It was a pleasure to take care of you today, and thank you for coming to our emergency department.  If you have any questions or concerns before leaving please ask the nurse to grab me and I'm more than happy to go through your aftercare instructions again.  If you were prescribed any opioid pain medication today such as Norco, Vicodin, Percocet, morphine, hydrocodone, or oxycodone please make sure you do not drive when you are taking this medication as it can alter your ability to drive safely.  If you have any concerns once you are home that you are not improving or are in fact getting worse before you can make it to your follow-up appointment, please do not hesitate to call [phone] and come back for further evaluation.  [name], MD  Results for orders placed or performed during the hospital encounter of 06/14/17  Comprehensive metabolic panel  Result Value Ref Range   Sodium 141 135 - 145 mmol/L   Potassium 3.8 3.5 - 5.1 mmol/L   Chloride 110 101 - 111 mmol/L   CO2 24 22 - 32 mmol/L   Glucose, Bld 86 65 - 99 mg/dL   BUN 11 6 - 20 mg/dL   Creatinine, Ser 1.61 0.61 - 1.24 mg/dL   Calcium 9.0 8.9 - 09.6 mg/dL   Total Protein 7.4 6.5 - 8.1 g/dL   Albumin 4.7 3.5 - 5.0 g/dL   AST 26 15 - 41 U/L   ALT 45 17 - 63 U/L   Alkaline Phosphatase 71 38 - 126 U/L   Total Bilirubin 0.5 0.3 - 1.2 mg/dL   GFR calc non Af Amer >60 >60 mL/min   GFR calc Af Amer >60 >60 mL/min   Anion gap 7 5 - 15  CBC  Result Value Ref Range   WBC 11.1 (H) 3.8 - 10.6 K/uL   RBC 5.77 4.40 - 5.90 MIL/uL   Hemoglobin 16.2 13.0 - 18.0 g/dL   HCT 04.5 40.9 - 81.1 %   MCV 81.2 80.0 - 100.0 fL   MCH 28.1 26.0 - 34.0 pg   MCHC 34.6 32.0 - 36.0 g/dL   RDW 91.4 78.2 - 95.6 %   Platelets 244 150 - 440 K/uL  Urinalysis, Complete w Microscopic  Result Value Ref Range   Color, Urine YELLOW (A) YELLOW   APPearance CLEAR (A) CLEAR   Specific Gravity, Urine 1.014 1.005 - 1.030   pH 7.0 5.0 - 8.0   Glucose, UA NEGATIVE NEGATIVE mg/dL   Hgb urine dipstick NEGATIVE NEGATIVE   Bilirubin Urine NEGATIVE NEGATIVE   Ketones, ur NEGATIVE NEGATIVE mg/dL   Protein, ur NEGATIVE NEGATIVE mg/dL   Nitrite NEGATIVE NEGATIVE   Leukocytes, UA NEGATIVE NEGATIVE   RBC / HPF NONE SEEN 0 - 5 RBC/hpf   WBC, UA 0-5 0 - 5 WBC/hpf   Bacteria, UA NONE SEEN NONE SEEN   Squamous Epithelial / LPF 0-5 (A) NONE SEEN   Mucus PRESENT    Amorphous Crystal PRESENT    Dg Chest 1 View  Result Date: 06/14/2017 CLINICAL DATA:  Right-sided chest pain for 2 days. EXAM: CHEST 1 VIEW COMPARISON:  11/01/2015 FINDINGS: The cardiomediastinal contours are normal. The lungs are clear. Pulmonary  vasculature is normal. No consolidation, pleural effusion, or pneumothorax. No acute osseous abnormalities are seen. IMPRESSION: Clear lungs.  No acute abnormality. Electronically Signed   By: Rubye Oaks M.D.   On: 06/14/2017 21:47   Ct Abdomen Pelvis W Contrast  Result Date: 06/14/2017 CLINICAL DATA:  Right flank pain for 2 days EXAM: CT ABDOMEN AND PELVIS WITH CONTRAST TECHNIQUE: Multidetector CT imaging of the abdomen and pelvis was performed using the standard protocol following bolus administration of intravenous contrast. CONTRAST:  ISOVUE-300 IOPAMIDOL (ISOVUE-300) INJECTION 61% COMPARISON:  Radiograph 12/25/2016, CT 06/15/2014 FINDINGS: Lower chest: No acute abnormality. Hepatobiliary: No focal liver abnormality is seen. No gallstones, gallbladder wall thickening, or biliary dilatation. Pancreas: Unremarkable. No pancreatic ductal dilatation or surrounding inflammatory changes. Spleen: Normal in size without focal abnormality. Adrenals/Urinary Tract:  Adrenal glands are unremarkable. Kidneys are normal, without renal calculi, focal lesion, or hydronephrosis. Bladder is unremarkable. Stomach/Bowel: Stomach is within normal limits. Appendix not well seen but no right lower quadrant inflammatory process is visualized. No evidence of bowel wall thickening, distention, or inflammatory changes. Vascular/Lymphatic: No significant vascular findings are present. No enlarged abdominal or pelvic lymph nodes. Reproductive: Prostate is unremarkable. Other: No abdominal wall hernia or abnormality. No abdominopelvic ascites. Musculoskeletal: No acute or significant osseous findings. IMPRESSION: No CT evidence for acute intra-abdominal or pelvic abnormality. Electronically Signed   By: Jasmine Pang M.D.   On: 06/14/2017 22:19

## 2017-06-14 NOTE — ED Provider Notes (Signed)
[facility] Emergency Department Provider Note  ____________________________________________   First MD Initiated Contact with Patient 06/14/17 2119     (approximate)  I have reviewed the triage vital signs and the nursing notes.   HISTORY  Chief Complaint Flank Pain    HPI Luke Conway. is a 26 y.o. male who self presents to the emergency department with 2 days of right flank pain. The pain is in his right flank and he feels this isn't his kidney also it's in his right upper back. He does have some nausea but no vomiting. No fevers or chills. The pain seems to be worse with movement. He is able to eat and drink without difficulty. He has no diarrhea. No history of abdominal surgeries. He has taken nothing for his pain. Nothing in particular seems to make it better.   Past Medical History:  Diagnosis Date  . GERD (gastroesophageal reflux disease)     There are no active problems to display for this patient.   History reviewed. No pertinent surgical history.  Prior to Admission medications   Medication Sig Start Date End Date Taking? Authorizing Provider  brompheniramine-pseudoephedrine-DM 30-2-10 MG/5ML syrup Take 10 mLs by mouth 4 (four) times daily as needed. 04/17/17   [name], PA-C  HYDROcodone-acetaminophen (NORCO) 5-325 MG tablet Take 1 tablet by mouth every 6 (six) hours as needed for severe pain. 06/14/17   [name], MD  ibuprofen (ADVIL,MOTRIN) 600 MG tablet Take 1 tablet (600 mg total) by mouth every 8 (eight) hours as needed. 06/14/17   [name], MD    Allergies Patient has no known allergies.  No family history on file.  Social History Social History  Substance Use Topics  . Smoking status: Current Every Day Smoker    Packs/day: 0.50    Types: Cigarettes  . Smokeless tobacco: Current User  . Alcohol use No    Review of Systems Constitutional: No fever/chills Eyes: No visual changes. ENT: No sore  throat. Cardiovascular: Denies chest pain. Respiratory: Denies shortness of breath. Gastrointestinal: positive abdominal pain.  Positive nausea, no vomiting.  No diarrhea.  No constipation. Genitourinary: Negative for dysuria. Musculoskeletal: positive for back pain. Skin: Negative for rash. Neurological: Negative for headaches, focal weakness or numbness.   ____________________________________________   PHYSICAL EXAM:  VITAL SIGNS: ED Triage Vitals  Enc Vitals Group     BP 06/14/17 2114 113/71     Pulse Rate 06/14/17 2114 79     Resp 06/14/17 2114 18     Temp 06/14/17 2114 98.3 F (36.8 C)     Temp Source 06/14/17 2114 Oral     SpO2 06/14/17 2114 98 %     Weight 06/14/17 2115 165 lb (74.8 kg)     Height 06/14/17 2115  (1.702 m)     Head Circumference --      Peak Flow --      Pain Score 06/14/17 2114 8     Pain Loc --      Pain Edu? --      Excl. in GC? --     Constitutional: alert and oriented 4 appears somewhat uncomfortable nontoxic no diaphoresis speaks full clear sentences Eyes: PERRL EOMI. Head: Atraumatic. Nose: No congestion/rhinnorhea. Mouth/Throat: No trismus Neck: No stridor.   Cardiovascular: Normal rate, regular rhythm. Grossly normal heart sounds.  Good peripheral circulation. Respiratory: Normal respiratory effort.  No retractions. Lungs CTAB and moving good air Gastrointestinal: soft nondistended quite tender right lower  quadrant greater than left lower quadrant with tenderness over McBurney's point  Musculoskeletal: he is exquisitely tender over his LAD on the right side with spasm Neurologic:  Normal speech and language. No gross focal neurologic deficits are appreciated. Skin:  Skin is warm, dry and intact. No rash noted. Psychiatric: Mood and affect are normal. Speech and behavior are normal.    ____________________________________________   DIFFERENTIAL includes but not limited to  appendicitis, kidney stone, pyelonephritis, pulmonary  embolism, muscle strain, muscle spasm ____________________________________________   LABS (all labs ordered are listed, but only abnormal results are displayed)  Labs Reviewed  CBC - Abnormal; Notable for the following:       Result Value   WBC 11.1 (*)    All other components within normal limits  URINALYSIS, COMPLETE (UACMP) WITH MICROSCOPIC - Abnormal; Notable for the following:    Color, Urine YELLOW (*)    APPearance CLEAR (*)    Squamous Epithelial / LPF 0-5 (*)    All other components within normal limits  COMPREHENSIVE METABOLIC PANEL    blood work reviewed and interpreted by me shows elevated white count which is nonspecific and could be secondary to stress and pain __________________________________________  EKG   ____________________________________________  RADIOLOGY  CT scan of the abdomen and pelvis reviewed by me shows no acute disease ____________________________________________   PROCEDURES  Procedure(s) performed: no  Procedures  Critical Care performed: no  Observation: no ____________________________________________   INITIAL IMPRESSION / ASSESSMENT AND PLAN / ED COURSE  Pertinent labs & imaging results that were available during my care of the patient were reviewed by me and considered in my medical decision making (see chart for details).  I appreciate the patient reports flank pain and chest pain, however he has exquisite tenderness in his abdomen particularly lower down. Concerned about possible appendicitis. CT scan is pending.     Fortunately the patient's CT scan is negative for acute intra-abdominal pathology. On reexamination the patient's pain is mostly in his upper back and he is quite tender with spasm. As he has no intra-abdominal pathology I feel his symptoms are most likely related to muscle strain. I will give him a trial of nonsteroidals along with a short course of opioids and  discharge. ____________________________________________   FINAL CLINICAL IMPRESSION(S) / ED DIAGNOSES  Final diagnoses:  Muscle strain  Muscle spasm      NEW MEDICATIONS STARTED DURING THIS VISIT:  New Prescriptions   HYDROCODONE-ACETAMINOPHEN (NORCO) 5-325 MG TABLET    Take 1 tablet by mouth every 6 (six) hours as needed for severe pain.   IBUPROFEN (ADVIL,MOTRIN) 600 MG TABLET    Take 1 tablet (600 mg total) by mouth every 8 (eight) hours as needed.     Note:  This document was prepared using Dragon voice recognition software and may include unintentional dictation errors.     [name], MD 06/14/17 2315

## 2017-06-14 NOTE — ED Notes (Signed)
Pt left the ED accompanied by his fiancee. Pt was advised not to drive since he received narcotics.

## 2017-08-02 DIAGNOSIS — I639 Cerebral infarction, unspecified: Secondary | ICD-10-CM | POA: Insufficient documentation

## 2017-08-03 DIAGNOSIS — F17200 Nicotine dependence, unspecified, uncomplicated: Secondary | ICD-10-CM | POA: Insufficient documentation

## 2017-08-19 ENCOUNTER — Emergency Department: Payer: Self-pay

## 2017-08-19 ENCOUNTER — Observation Stay
Admission: EM | Admit: 2017-08-19 | Discharge: 2017-08-20 | Disposition: A | Discharge status: Home / Self Care | Payer: Self-pay | Attending: Internal Medicine | Admitting: Internal Medicine

## 2017-08-19 ENCOUNTER — Other Ambulatory Visit: Payer: Self-pay

## 2017-08-19 DIAGNOSIS — Z8673 Personal history of transient ischemic attack (TIA), and cerebral infarction without residual deficits: Secondary | ICD-10-CM | POA: Insufficient documentation

## 2017-08-19 DIAGNOSIS — F17210 Nicotine dependence, cigarettes, uncomplicated: Secondary | ICD-10-CM | POA: Insufficient documentation

## 2017-08-19 DIAGNOSIS — R202 Paresthesia of skin: Secondary | ICD-10-CM

## 2017-08-19 DIAGNOSIS — R2681 Unsteadiness on feet: Secondary | ICD-10-CM | POA: Insufficient documentation

## 2017-08-19 DIAGNOSIS — Z79899 Other long term (current) drug therapy: Secondary | ICD-10-CM | POA: Insufficient documentation

## 2017-08-19 DIAGNOSIS — K219 Gastro-esophageal reflux disease without esophagitis: Secondary | ICD-10-CM | POA: Insufficient documentation

## 2017-08-19 DIAGNOSIS — Z7982 Long term (current) use of aspirin: Secondary | ICD-10-CM | POA: Insufficient documentation

## 2017-08-19 DIAGNOSIS — R531 Weakness: Principal | ICD-10-CM | POA: Insufficient documentation

## 2017-08-19 HISTORY — DX: Cerebral infarction, unspecified: I63.9

## 2017-08-19 LAB — DIFFERENTIAL
BASOS ABS: 0.1 10*3/uL (ref 0–0.1)
BASOS PCT: 1 %
EOS ABS: 0.2 10*3/uL (ref 0–0.7)
EOS PCT: 2 %
LYMPHS ABS: 3.1 10*3/uL (ref 1.0–3.6)
Lymphocytes Relative: 26 %
MONOS PCT: 7 %
Monocytes Absolute: 0.9 10*3/uL (ref 0.2–1.0)
Neutro Abs: 7.5 10*3/uL — ABNORMAL HIGH (ref 1.4–6.5)
Neutrophils Relative %: 64 %

## 2017-08-19 LAB — COMPREHENSIVE METABOLIC PANEL
ALT: 176 U/L — ABNORMAL HIGH (ref 17–63)
ANION GAP: 13 (ref 5–15)
AST: 61 U/L — AB (ref 15–41)
Albumin: 4.6 g/dL (ref 3.5–5.0)
Alkaline Phosphatase: 99 U/L (ref 38–126)
BILIRUBIN TOTAL: 0.4 mg/dL (ref 0.3–1.2)
BUN: 14 mg/dL (ref 6–20)
CHLORIDE: 108 mmol/L (ref 101–111)
CO2: 20 mmol/L — ABNORMAL LOW (ref 22–32)
Calcium: 9.4 mg/dL (ref 8.9–10.3)
Creatinine, Ser: 0.77 mg/dL (ref 0.61–1.24)
Glucose, Bld: 90 mg/dL (ref 65–99)
POTASSIUM: 3.8 mmol/L (ref 3.5–5.1)
Sodium: 141 mmol/L (ref 135–145)
TOTAL PROTEIN: 7.5 g/dL (ref 6.5–8.1)

## 2017-08-19 LAB — CBC
HEMATOCRIT: 47.6 % (ref 40.0–52.0)
Hemoglobin: 16.3 g/dL (ref 13.0–18.0)
MCH: 27.9 pg (ref 26.0–34.0)
MCHC: 34.3 g/dL (ref 32.0–36.0)
MCV: 81.5 fL (ref 80.0–100.0)
Platelets: 249 10*3/uL (ref 150–440)
RBC: 5.84 MIL/uL (ref 4.40–5.90)
RDW: 12.9 % (ref 11.5–14.5)
WBC: 11.8 10*3/uL — ABNORMAL HIGH (ref 3.8–10.6)

## 2017-08-19 LAB — ETHANOL: ALCOHOL ETHYL (B): 33 mg/dL — AB (ref <10)

## 2017-08-19 LAB — PROTIME-INR
INR: 0.94
Prothrombin Time: 12.5 seconds (ref 11.4–15.2)

## 2017-08-19 LAB — APTT: APTT: 28 s (ref 24–36)

## 2017-08-19 LAB — TROPONIN I

## 2017-08-19 MED ORDER — IOPAMIDOL (ISOVUE-370) INJECTION 76%
75.0000 mL | Freq: Once | INTRAVENOUS | Status: AC | PRN
  Administered 2017-08-19: 75 mL via INTRAVENOUS

## 2017-08-19 MED ORDER — ACETAMINOPHEN 325 MG PO TABS
650.0000 mg | ORAL_TABLET | Freq: Once | ORAL | Status: AC
  Administered 2017-08-19: 650 mg via ORAL
  Filled 2017-08-19: qty 2

## 2017-08-19 MED ORDER — ASPIRIN 81 MG PO CHEW
324.0000 mg | CHEWABLE_TABLET | Freq: Once | ORAL | Status: AC
  Administered 2017-08-19: 324 mg via ORAL
  Filled 2017-08-19: qty 4

## 2017-08-19 NOTE — ED Triage Notes (Signed)
Pt presents to ED via POV with c/o LEFT-sided facial, arm and leg numbness. Pt reports driving at [employer] [phone] mins PTA when s/x's started. Pt reports h/x of CVA 2 1/2 weeks ago and was [facility]. Pt reports new s/x's of slurred speech and unilateral weakness on the LEFT side.

## 2017-08-19 NOTE — Consult Note (Signed)
   [facility] Tele[facility]urology Consult Services  Impression: weakness. Possible right hemispheric stroke, symptoms on going for past two days, tpa 2 weeks ago. Exam inconsistent   Not a tpa candidate due to: recent stroke, out of therapeutic window  Not an NIR candidate due to: not lvo clinically   Comments:   [facility] contacted: 2142 [facility] at bedside: 2152  Recommendations:  admit stroke/telemetry neuro checks dvt prophy dysphagia screen asa if no contraindications head of bed flat iv fluids ns inpatient neurology consultation Inpatient stroke evaluation as per Neurology/ Internal Medicine Discussed with ED MD  -----------------------------------------------------------------------------------------  CC left sided weakness  History of Present Illness   Patient is a  26 y.o. man who comes to the hospital for left sided weakness. He describes that for the past two days his left leg has been numb and off. Today, he was driving around [id number], and he spaced out and then had left face/arm numbness and weakness on the left arm and leg. He feels his symptom from the past two days have worsened. [facility] for same symptoms and received tpa. The sympotms resolved later on. Per patient his mri showed a stroke, but discussing with Ed md it seems the mri of the head did not show any acute findings. The symptoms from the previous stroke resolved, but it took some time- a few days before they resolved.  Diagnostic: Ct head without contrast:  IMPRESSION: Negative head CT.  Vitals:   08/19/17 2106  BP: 126/67  Pulse: 96  Resp: 16  Temp: 98 F (36.7 C)  SpO2: 97%     Exam:  NIHSS score: 5 (inconsistent exam at times)  1A: Level of Consciousness - Alert; keenly responsive 1B: Ask Month and Age - Both Questions Right 1C: 'Blink Eyes' & 'Squeeze Hands' - Performs Both Tasks 2: Test Horizontal Extraocular Movements - Normal 3: Test Visual  Fields - No Visual Loss 4: Test Facial Palsy - Normal symmetry 5A: Test Left Arm Motor Drift - Some Effort Against Gravity 5B: Test Right Arm Motor Drift - No Drift for 10 Seconds 6A: Test Left Leg Motor Drift - Some Effort Against Gravity 6B: Test Right Leg Motor Drift - No Drift for 5 Seconds 7: Test Limb Ataxia - No Ataxia 8: Test Sensation - Mild-Moderate Loss: Less Sharp/More Dull 9: Test Language/Aphasia - Normal; No aphasia 10: Test Dysarthria - Normal 11: Test Extinction/Inattention - No abnormality       Medical Decision Making:  - Extensive number of diagnosis or management options are considered above.   - Extensive amount of complex data reviewed.   - High risk of complication and/or morbidity or mortality are associated with differential diagnostic considerations above.  - There may be Uncertain outcome and increased probability of prolonged functional impairment or high probability of severe prolonged functional impairment associated with some of these differential diagnosis.  Medical Data Reviewed:  1.Data reviewed include clinical labs, radiology,  Medical Tests;   2.Tests results discussed w/performing or interpreting physician;   3.Obtaining/reviewing old medical records;  4.Obtaining case history from another source;  5.Independent review of image, tracing or specimen.    Patient was informed the Neurology Consult would happen via telehealth (remote video) and consented to receiving care in this manner.

## 2017-08-19 NOTE — Progress Notes (Signed)
Chaplain responded to a Code Stroke page for pt. in ED01. CH arrived when Medical team was evaluating pt. Pt alert and talking. Wife and daughter were at bedside. CH provided a ministry of presence and left when Pt's wife said they were fine.    08/19/17 2200  Clinical Encounter Type  Visited With Patient;Patient and family together  Visit Type Initial;Code;ED;Other (Comment)  Referral From Nurse  Consult/Referral To Chaplain  Spiritual Encounters  Spiritual Needs Other (Comment)

## 2017-08-19 NOTE — ED Notes (Signed)
CT called to notify of pt enroute to department; charge nurse notified of pt's presenting c/o

## 2017-08-19 NOTE — ED Provider Notes (Addendum)
[facility] Emergency Department Provider Note   ____________________________________________   First MD Initiated Contact with Patient 08/19/17 2122     (approximate)  I have reviewed the triage vital signs and the nursing notes.   HISTORY  Chief Complaint Numbness    HPI Luke ChurchKenneth D Maland Jr. is a 26 y.o. male Patient reports numbness and weakness on the left side starting 2030 [facility] weeks ago when he got tPA for stroke at that time MRI later showed everything was normal. His only other medical problem is reflux. Reviewing [facility] seems like everything in the workup was negative.   Past Medical History:  Diagnosis Date  . CVA (cerebral vascular accident) (HCC)   . GERD (gastroesophageal reflux disease)     There are no active problems to display for this patient.   History reviewed. No pertinent surgical history.  Prior to Admission medications   Medication Sig Start Date End Date Taking? Authorizing Provider  aspirin EC 81 MG tablet Take 81 mg by mouth daily.   Yes [provider]  atorvastatin (LIPITOR) 80 MG tablet Take 80 mg by mouth daily.   Yes [provider]  esomeprazole (NEXIUM) 20 MG capsule Take 20 mg by mouth daily at 12 noon.   Yes [provider]  brompheniramine-pseudoephedrine-DM 30-2-10 MG/5ML syrup Take 10 mLs by mouth 4 (four) times daily as needed. Patient not taking: Reported on 08/19/2017 04/17/17   Tommi RumpsSummers, Rhonda L, PA-C  HYDROcodone-acetaminophen [facility](NORCO) 5-325 MG tablet Take 1 tablet by mouth every 6 (six) hours as needed for severe pain. Patient not taking: Reported on 08/19/2017 06/14/17   Merrily Brittleifenbark, Neil, MD  ibuprofen (ADVIL,MOTRIN) 600 MG tablet Take 1 tablet (600 mg total) by mouth every 8 (eight) hours as needed. Patient not taking: Reported on 08/19/2017 06/14/17   Merrily Brittleifenbark, Neil, MD    Allergies Patient has no known  allergies.  History reviewed. No pertinent family history.  Social History Social History   Tobacco Use  . Smoking status: Current Every Day Smoker    Packs/day: 0.50    Types: Cigarettes  . Smokeless tobacco: Current User  Substance Use Topics  . Alcohol use: No  . Drug use: Not on file    Review of Systems  Constitutional: No fever/chills Eyes: No visual changes. ENT: No sore throat. Cardiovascular: Denies chest pain. Respiratory: Denies shortness of breath. Gastrointestinal: No abdominal pain.  No nausea, no vomiting.  No diarrhea.  No constipation. Genitourinary: Negative for dysuria. Musculoskeletal: Negative for back pain. Skin: Negative for rash. Neurological: see history of present illness ____________________________________________   PHYSICAL EXAM:  VITAL SIGNS: ED Triage Vitals [08/19/17 2106]  Enc Vitals Group     BP 126/67     Pulse Rate 96     Resp 16     Temp 98 F (36.7 C)     Temp Source Oral     SpO2 97 %     Weight 170 lb (77.1 kg)     Height 5\' 7"  (1.702 m)     Head Circumference      Peak Flow      Pain Score      Pain Loc      Pain Edu?      Excl. in GC?     Constitutional: Alert and oriented. Well appearing and in no acute distress. Eyes: Conjunctivae are normal. PER EOMI. Head: Atraumatic. Nose: No  congestion/rhinnorhea. Mouth/Throat: Mucous membranes are moist.  Oropharynx non-erythematous. Neck: No stridor.   Cardiovascular: Normal rate, regular rhythm. Grossly normal heart sounds.  Good peripheral circulation. Respiratory: Normal respiratory effort.  No retractions. Lungs CTAB. Gastrointestinal: Soft and nontender. No distention. No abdominal bruits. No CVA tenderness. Musculoskeletal: No lower extremity tenderness nor edema.  No joint effusions. Neurologic:  Normal speech and language. patient complains of numbness on his left side and his left side appears to be quite weak. He is unable to hold his left hand up or grip with  any strength. Leg is similarly weak Skin:  Skin is warm, dry and intact. No rash noted. Psychiatric: Mood and affect are normal. Speech and behavior are normal.  ____________________________________________   LABS (all labs ordered are listed, but only abnormal results are displayed)  Labs Reviewed  CBC - Abnormal; Notable for the following components:      Result Value   WBC 11.8 (*)    All other components within normal limits  DIFFERENTIAL - Abnormal; Notable for the following components:   Neutro Abs 7.5 (*)    All other components within normal limits  COMPREHENSIVE METABOLIC PANEL - Abnormal; Notable for the following components:   CO2 20 (*)    AST 61 (*)    ALT 176 (*)    All other components within normal limits  ETHANOL - Abnormal; Notable for the following components:   Alcohol, Ethyl (B) 33 (*)    All other components within normal limits  PROTIME-INR  APTT  TROPONIN I  URINE DRUG SCREEN, QUALITATIVE ([facility] ONLY)  URINALYSIS, ROUTINE W REFLEX MICROSCOPIC  SEDIMENTATION RATE  CBG MONITORING, ED   ____________________________________________  EKG  EKG read and interpreted by me shows normal sinus rhythm rate of 84 normal axis essentially normal EKG ____________________________________________  RADIOLOGY  CT of the head was read as negative CT angiogram is normal ____________________________________________   PROCEDURES  Procedure(s) performed:   Procedures  Critical Care performed:   ____________________________________________   INITIAL IMPRESSION / ASSESSMENT AND PLAN / ED COURSE  [facility] patient got TPA there when he was there about 2 and half weeks ago.    discussed with neurolog[facility] on[facility], Dr Lorenso CourierPowers. They do not have beds do not think he can get tPA again so soon because of the had a stroke earlier than he should get tPA and if he didn't have a stroke earlier he obviously should get tPA either.[facility] doc is  suspicious of the patient since he had a complete and total recovery with no deficits and nothing on the MRI. Our telemetry neurologist is also suspicious. We will try to get a CT angiogram head and neck and see if that shows anything and then an MRI. Probably admit the patient.  CT angiogram has just come back negative. [place] neurology still want to admit the patient was probably a good idea because even if there is no sign of strokeon the MR the patient still will need to see psychiatry. The patient has had aspirin as teleneurology  Suggested.  pill neurology had said that patient's foot should be externally rotated if the weakness was realigned it is not. Additionally I had my hands under the patient's heels and asked him to first pressed down with his left foot and then his right foot. He could not pressed down with his left foot did not pressed down much with the right foot and then I had him elevate his right foot  did not really seem to push down much with his left foot either. ____________________________________________   FINAL CLINICAL IMPRESSION(S) / ED DIAGNOSES  Final diagnoses:  Weakness  Paresthesias     ED Discharge Orders    None       Note:  This document was prepared using Dragon voice recognition software and may include unintentional dictation errors.    Arnaldo Natal, MD 08/19/17 2317    Arnaldo Natal, MD 08/19/17 4098    Arnaldo Natal, MD 08/20/17 [phone]

## 2017-08-20 ENCOUNTER — Encounter: Payer: Self-pay | Admitting: *Deleted

## 2017-08-20 ENCOUNTER — Other Ambulatory Visit: Payer: Self-pay

## 2017-08-20 DIAGNOSIS — R531 Weakness: Secondary | ICD-10-CM

## 2017-08-20 LAB — URINALYSIS, ROUTINE W REFLEX MICROSCOPIC
Bilirubin Urine: NEGATIVE
GLUCOSE, UA: NEGATIVE mg/dL
Hgb urine dipstick: NEGATIVE
Ketones, ur: NEGATIVE mg/dL
LEUKOCYTES UA: NEGATIVE
NITRITE: NEGATIVE
PROTEIN: NEGATIVE mg/dL
Specific Gravity, Urine: >1.046 — ABNORMAL HIGH (ref 1.005–1.030)
pH: 5 (ref 5.0–8.0)

## 2017-08-20 LAB — URINE DRUG SCREEN, QUALITATIVE (ARMC ONLY)
AMPHETAMINES, UR SCREEN: NOT DETECTED
BENZODIAZEPINE, UR SCRN: NOT DETECTED
Barbiturates, Ur Screen: NOT DETECTED
Cannabinoid 50 Ng, Ur ~~LOC~~: NOT DETECTED
Cocaine Metabolite,Ur ~~LOC~~: NOT DETECTED
MDMA (Ecstasy)Ur Screen: NOT DETECTED
Methadone Scn, Ur: NOT DETECTED
OPIATE, UR SCREEN: NOT DETECTED
Phencyclidine (PCP) Ur S: NOT DETECTED
TRICYCLIC, UR SCREEN: NOT DETECTED

## 2017-08-20 LAB — CREATININE, SERUM
CREATININE: 0.66 mg/dL (ref 0.61–1.24)
GFR calc non Af Amer: >60 mL/min (ref >60)

## 2017-08-20 LAB — LIPID PANEL
CHOLESTEROL: 77 mg/dL (ref 0–200)
HDL: 28 mg/dL — ABNORMAL LOW (ref >40)
LDL Cholesterol: 43 mg/dL (ref 0–99)
Total CHOL/HDL Ratio: 2.8 RATIO
Triglycerides: 28 mg/dL (ref <150)
VLDL: 6 mg/dL (ref 0–40)

## 2017-08-20 LAB — VITAMIN B12: VITAMIN B 12: 409 pg/mL (ref 180–914)

## 2017-08-20 LAB — CBC
HCT: 43.8 % (ref 40.0–52.0)
HEMOGLOBIN: 15.1 g/dL (ref 13.0–18.0)
MCH: 28.2 pg (ref 26.0–34.0)
MCHC: 34.4 g/dL (ref 32.0–36.0)
MCV: 82.1 fL (ref 80.0–100.0)
Platelets: 225 10*3/uL (ref 150–440)
RBC: 5.34 MIL/uL (ref 4.40–5.90)
RDW: 13.2 % (ref 11.5–14.5)
WBC: 9.4 10*3/uL (ref 3.8–10.6)

## 2017-08-20 LAB — HEMOGLOBIN A1C
Hgb A1c MFr Bld: 4.9 % (ref 4.8–5.6)
MEAN PLASMA GLUCOSE: 93.93 mg/dL

## 2017-08-20 LAB — TSH: TSH: 2.937 u[IU]/mL (ref 0.350–4.500)

## 2017-08-20 MED ORDER — ACETAMINOPHEN 160 MG/5ML PO SOLN: 650.0000 mg | ORAL | Status: DC | PRN

## 2017-08-20 MED ORDER — ACETAMINOPHEN 325 MG PO TABS: 650.0000 mg | ORAL_TABLET | ORAL | Status: DC | PRN

## 2017-08-20 MED ORDER — PANTOPRAZOLE SODIUM 40 MG PO TBEC
40.0000 mg | DELAYED_RELEASE_TABLET | Freq: Every day | ORAL | Status: DC
  Administered 2017-08-20: 09:00:00 40 mg via ORAL
  Filled 2017-08-20: qty 1

## 2017-08-20 MED ORDER — ENOXAPARIN SODIUM 40 MG/0.4ML ~~LOC~~ SOLN: 40.0000 mg | SUBCUTANEOUS | Status: DC

## 2017-08-20 MED ORDER — ACETAMINOPHEN 650 MG RE SUPP: 650.0000 mg | RECTAL | Status: DC | PRN

## 2017-08-20 MED ORDER — STROKE: EARLY STAGES OF RECOVERY BOOK
Freq: Once | Status: AC
  Administered 2017-08-20: 03:00:00

## 2017-08-20 MED ORDER — SODIUM CHLORIDE 0.9 % IV BOLUS (SEPSIS)
1000.0000 mL | INTRAVENOUS | Status: DC | PRN
  Administered 2017-08-20: 1000 mL via INTRAVENOUS

## 2017-08-20 MED ORDER — ATORVASTATIN CALCIUM 20 MG PO TABS
80.0000 mg | ORAL_TABLET | Freq: Every day | ORAL | Status: DC
Start: 2017-08-20 — End: 2017-08-20
  Administered 2017-08-20: 09:00:00 80 mg via ORAL
  Filled 2017-08-20: qty 4

## 2017-08-20 MED ORDER — ASPIRIN EC 81 MG PO TBEC: 81.0000 mg | DELAYED_RELEASE_TABLET | Freq: Every day | ORAL | Status: DC

## 2017-08-20 NOTE — Evaluation (Signed)
Physical Therapy Evaluation Patient Details Name: Luke ChurchKenneth D Lacombe Jr. MRN: [id number] DOB: 25-Aug-1991 Today's Date: 08/20/2017   History of Present Illness  Patient is a 26 y/o male that presents with complaints of L sided weakness and decreased sensation. [facility] for similar complaints and was given tPA. 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   Clinical Impression  Patient reports he had an episode 2 weeks ago where he experienced L sided weakness and numbness. He reports he had a CVA, however his medical records do not support this. Of note he did receive TPA during episode two weeks ago. He is quite inconsistent with this evaluation, when he is ambulating while conversing with therapist he does not demonstrate any gait abnormalities. When he is not discussing with therapist he is noted to have a L sided limp, though no foot drop. His finger nose finger, heel slides, and clonus tests were all negative. He does not demonstrate any deficits in MMT, reports mild sensory deficit in lateral L lower leg. He does not have any acute mobility needs, and declines outpatient physical therapy. He will be monitored during his acute hospitalization, but does not appear to desire any further services after this.     Follow Up Recommendations Outpatient PT    Equipment Recommendations       Recommendations for Other Services       Precautions / Restrictions Precautions Precautions: Fall Restrictions Weight Bearing Restrictions: No      Mobility  Bed Mobility Overal bed mobility: Independent             General bed mobility comments: No obvious deficits noted in bed mobility.   Transfers Overall transfer level: Independent Equipment used: None             General transfer comment: Patient is unable to perform transfer without use  of UEs, favors R side but reports he has done this for years after old L foot injury.   Ambulation/Gait Ambulation/Gait assistance: Supervision Ambulation Distance (Feet): 300 Feet Assistive device: None Gait Pattern/deviations: Step-through pattern   Gait velocity interpretation: at or above normal speed for age/gender General Gait Details: Patient demonstrates normal gait pattern when conversing with therapist, without conversation he demonstrates L quadricep deficits (mild limp) no lateral sway.   Stairs            Wheelchair Mobility    Modified Rankin (Stroke Patients Only)       Balance Overall balance assessment: Independent                                           Pertinent Vitals/Pain Pain Assessment: No/denies pain    Home Living Family/patient expects to be discharged to:: Private residence Living Arrangements: Other relatives(Uncle) Available Help at Discharge: Family Type of Home: House Home Access: Level entry     Home Layout: One level Home Equipment: None      Prior Function Level of Independence: Independent               Hand Dominance        Extremity/Trunk Assessment   Upper Extremity Assessment Upper Extremity Assessment: LUE deficits/detail LUE Deficits / Details: Very mild  deficit in pushing MMT, he is able to hold his LUE in flexion at end range for > 30", no grip strength deficit noted. Finger nose finger WNL bilaterally     Lower Extremity Assessment Lower Extremity Assessment: LLE deficits/detail LLE Deficits / Details: He demonstrates 5/5 L hip flexor, L quadricep strength. 4+/ or 5/5 L Plantar flexor, inversion, eversion strength unclear if due to PT gripping and soem discomfort or poor effort or true nervous injury. Clonus testing negative. Heel slides WNL on LLE        Communication   Communication: No difficulties  Cognition Arousal/Alertness: Awake/alert Behavior During Therapy: WFL for tasks  assessed/performed Overall Cognitive Status: Within Functional Limits for tasks assessed                                        General Comments General comments (skin integrity, edema, etc.): Modified DGI of 12/12, single leg stance on LLE < 3", RLE > 5", able to perform 10 SLRs on LLE     Exercises     Assessment/Plan    PT Assessment Patient needs continued PT services  PT Problem List Decreased balance;Decreased strength       PT Treatment Interventions Gait training;Therapeutic activities;DME instruction;Therapeutic exercise;Stair training;Balance training;Neuromuscular re-education;Functional mobility training    PT Goals (Current goals can be found in the Care Plan section)  Acute Rehab PT Goals Patient Stated Goal: To figure out what's going on.  PT Goal Formulation: With patient Time For Goal Achievement: 09/03/17 Potential to Achieve Goals: Good    Frequency Min 2X/week   Barriers to discharge        Co-evaluation               AM-PAC PT "6 Clicks" Daily Activity  Outcome Measure Difficulty turning over in bed (including adjusting bedclothes, sheets and blankets)?: None Difficulty moving from lying on back to sitting on the side of the bed? : None Difficulty sitting down on and standing up from a chair with arms (e.g., wheelchair, bedside commode, etc,.)?: None Help needed moving to and from a bed to chair (including a wheelchair)?: None Help needed walking in hospital room?: None Help needed climbing 3-5 steps with a railing? : None 6 Click Score: 24    End of Session Equipment Utilized During Treatment: Gait belt Activity Tolerance: Patient tolerated treatment well Patient left: in bed;with call bell/phone within reach;with bed alarm set Nurse Communication: Mobility status PT Visit Diagnosis: Unsteadiness on feet (R26.81)    Time: [id number] PT Time Calculation (min) (ACUTE ONLY): 20 min   Charges:   PT Evaluation $PT Eval  Low Complexity: 1 Low     PT G Codes:   PT G-Codes **NOT FOR INPATIENT CLASS** Functional Assessment Tool Used: AM-PAC 6 Clicks Basic Mobility Functional Limitation: Mobility: Walking and moving around Mobility: Walking and Moving Around Current Status (V4098(G8978): At least 1 percent but less than 20 percent impaired, limited or restricted Mobility: Walking and Moving Around Goal Status [id number](G8979): At least 1 percent but less than 20 percent impaired, limited or restricted   Alva GarnetPatrick McNamara PT, DPT, CSCS     08/20/2017, 2:29 PM

## 2017-08-20 NOTE — Plan of Care (Signed)
  Progressing Education: Knowledge of General Education information will improve 08/20/2017 1452 - Progressing by [name], Lemmie Steinhaus, RN Activity: Risk for activity intolerance will decrease 08/20/2017 1452 - Progressing by [name], Naleah Kofoed, RN Pain Managment: General experience of comfort will improve 08/20/2017 1452 - Progressing by [name], Camira Geidel, RN Safety: Ability to remain free from injury will improve 08/20/2017 1452 - Progressing by [name], Brylan Seubert, RN Skin Integrity: Risk for impaired skin integrity will decrease 08/20/2017 1452 - Progressing by [name], Dakarai Mcglocklin, RN Education: Knowledge of disease or condition will improve 08/20/2017 1452 - Progressing by [name], Rodolfo Gaster, RN Knowledge of secondary prevention will improve 08/20/2017 1452 - Progressing by [name], Mckinsley Koelzer, RN Knowledge of patient specific risk factors addressed and post discharge goals established will improve 08/20/2017 1452 - Progressing by [name], Erendira Crabtree, RN Ischemic Stroke/TIA Tissue Perfusion: Complications of ischemic stroke/TIA will be minimized 08/20/2017 1452 - Progressing by [name], Marija Calamari, RN

## 2017-08-20 NOTE — Evaluation (Addendum)
Occupational Therapy Evaluation Patient Details Name: Luke ChurchKenneth D Woulfe Jr. MRN: [id number] DOB: 05-10-1991 Today's Date: 08/20/2017    History of Present Illness Pt. is a 26 y.o. male who [facility] with left sided weakness, and impaired sensation. Pt. [facility] for similiar symptoms, and was given TPA. Imaging is negative. Pt. PMHx includes: GERD.   Clinical Impression   Pt. Is a 26 y.o. Male who [facility] with left sided weakness, and impaired sensation. Pt. reports his symptoms have resolved, and that his LUE functioning, and ADL functioning are back to baseline. Pt. Resides with his uncle, however reports that he spends 95% of his time at his girlfriend's home. Pt. Reports upon discharge he plans to spend some time at his parents home. There are currently no deficits warranting OT intervention at this time. Pt. Is in agreement. Will complete the order.      Follow Up Recommendations  No OT follow up    Equipment Recommendations       Recommendations for Other Services       Precautions / Restrictions Precautions Precautions: Fall Restrictions Weight Bearing Restrictions: No         Balance Overall balance assessment: Independent                                         ADL either performed or assessed with clinical judgement   ADL Overall ADL's : Independent                                             Vision  No vision changes       Perception     Praxis      Pertinent Vitals/Pain Pain Assessment: 0-10  No pain     Hand Dominance Right   Extremity/Trunk Assessment Upper Extremity Assessment Upper Extremity Assessment: Overall WFL for tasks assessed LUE Deficits / Details: LUE WNL. Pt.  reports LUE is back to baseline.   Lower Extremity Assessment Lower Extremity Assessment: LLE deficits/detail LLE Deficits / Details: He demonstrates 5/5 L hip flexor, L quadricep strength.  4+/ or 5/5 L Plantar flexor, inversion, eversion strength unclear if due to PT gripping and soem discomfort or poor effort or true nervous injury. Clonus testing negative. Heel slides WNL on LLE        Communication Communication Communication: No difficulties   Cognition Arousal/Alertness: Awake/alert Behavior During Therapy: WFL for tasks assessed/performed Overall Cognitive Status: Within Functional Limits for tasks assessed                                     General Comments    Exercises     Shoulder Instructions      Home Living Family/patient expects to be discharged to:: Private residence Living Arrangements: Other relatives(Uncle) Available Help at Discharge: Family Type of Home: House Home Access: Level entry     Home Layout: One level               Home Equipment: None          Prior Functioning/Environment Level of Independence: Independent  OT Problem List:        OT Treatment/Interventions:      OT Goals(Current goals can be found in the care plan section) Acute Rehab OT Goals Patient Stated Goal: To get better OT Goal Formulation: With patient Potential to Achieve Goals: Good  OT Frequency:     Barriers to D/C:            Co-evaluation              AM-PAC PT "6 Clicks" Daily Activity     Outcome Measure Help from another person eating meals?: None Help from another person taking care of personal grooming?: None Help from another person toileting, which includes using toliet, bedpan, or urinal?: None Help from another person bathing (including washing, rinsing, drying)?: None Help from another person to put on and taking off regular upper body clothing?: None Help from another person to put on and taking off regular lower body clothing?: None 6 Click Score: 24   End of Session    Activity Tolerance: Patient tolerated treatment well Patient left: in bed;with call bell/phone within  reach;with bed alarm set  OT Visit Diagnosis: Muscle weakness (generalized) (M62.81)                Time: [id number] OT Time Calculation (min): 16 min Charges:  OT General Charges $OT Visit: 1 Visit OT Evaluation $OT Eval Low Complexity: 1 Low G-Codes: OT G-codes **NOT FOR INPATIENT CLASS** Functional Assessment Tool Used: AM-PAC 6 Clicks Daily Activity Functional Limitation: Self care Self Care Current Status (J4782(G8987): 0 percent impaired, limited or restricted Self Care Goal Status (N5621(G8988): 0 percent impaired, limited or restricted Self Care Discharge Status (H0865(G8989): 0 percent impaired, limited or restricted   Luke MessierElaine Lilton Pare, MS, OTR/L   Luke MessierElaine Priyanka Causey, MS, OTR/L 08/20/2017, 2:40 PM

## 2017-08-20 NOTE — H&P (Signed)
[facility] - [place] [facility]   PATIENT NAME: Luke Conway    MR#:  [id number]  DATE OF BIRTH:  1990-12-11  DATE OF ADMISSION:  08/19/2017  PRIMARY CARE PHYSICIAN: Patient, No Pcp Per   REQUESTING/REFERRING PHYSICIAN: Darnelle Catalan, MD  CHIEF COMPLAINT:   Chief Complaint  Patient presents with  . Numbness    HISTORY OF PRESENT ILLNESS:  Luke Conway  is a 26 y.o. male who presents with sided weakness and numbness.  Patient was admitted at [facility] hospitals 2-1/2 weeks ago for similar complaints.  At that time he was given TPA however, his MRI did not show stroke.  He states that today he had similar symptoms but decided to come to the ED specifically when his left foot became numb.  Hospitals were called for admission and further evaluation  PAST MEDICAL HISTORY:   Past Medical History:  Diagnosis Date  . CVA (cerebral vascular accident) (HCC)   . GERD (gastroesophageal reflux disease)     PAST SURGICAL HISTORY:   Past Surgical History:  Procedure Laterality Date  . NO PAST SURGERIES      SOCIAL HISTORY:   Social History   Tobacco Use  . Smoking status: Current Every Day Smoker    Packs/day: 0.50    Types: Cigarettes  . Smokeless tobacco: Current User  Substance Use Topics  . Alcohol use: No    FAMILY HISTORY:   Family History  Problem Relation Age of Onset  . Diabetes Mother   . Heart attack Mother   . Crohn's disease Father   . Hypertension Father     DRUG ALLERGIES:  No Known Allergies  MEDICATIONS AT HOME:   Prior to Admission medications   Medication Sig Start Date End Date Taking? Authorizing Provider  aspirin EC 81 MG tablet Take 81 mg by mouth daily.   Yes [provider]  atorvastatin (LIPITOR) 80 MG tablet Take 80 mg by mouth daily.   Yes [provider]  esomeprazole (NEXIUM) 20 MG capsule Take 20 mg by mouth daily at 12 noon.   Yes [provider]  brompheniramine-pseudoephedrine-DM  30-2-10 MG/5ML syrup Take 10 mLs by mouth 4 (four) times daily as needed. Patient not taking: Reported on 08/19/2017 04/17/17   [name], PA-C  HYDROcodone-acetaminophen [facility]) 5-325 MG tablet Take 1 tablet by mouth every 6 (six) hours as needed for severe pain. Patient not taking: Reported on 08/19/2017 06/14/17   [name], MD  ibuprofen (ADVIL,MOTRIN) 600 MG tablet Take 1 tablet (600 mg total) by mouth every 8 (eight) hours as needed. Patient not taking: Reported on 08/19/2017 06/14/17   [name], MD    REVIEW OF SYSTEMS:  Review of Systems  Constitutional: Negative for chills, fever, malaise/fatigue and weight loss.  HENT: Negative for ear pain, hearing loss and tinnitus.   Eyes: Negative for blurred vision, double vision, pain and redness.  Respiratory: Negative for cough, hemoptysis and shortness of breath.   Cardiovascular: Negative for chest pain, palpitations, orthopnea and leg swelling.  Gastrointestinal: Negative for abdominal pain, constipation, diarrhea, nausea and vomiting.  Genitourinary: Negative for dysuria, frequency and hematuria.  Musculoskeletal: Negative for back pain, joint pain and neck pain.  Skin:       No acne, rash, or lesions  Neurological: Positive for sensory change and focal weakness. Negative for dizziness, tremors and weakness.  Endo/Heme/Allergies: Negative for polydipsia. Does not bruise/bleed easily.  Psychiatric/Behavioral: Negative for depression. The patient is not nervous/anxious and does  not have insomnia.      VITAL SIGNS:   Vitals:   08/19/17 2247 08/19/17 2248 08/19/17 2248 08/19/17 2249  BP: 120/68  122/72   Pulse:  75 86 73  Resp:  17 18 20   Temp:      TempSrc:      SpO2:  98% 97% 99%  Weight:      Height:       Wt Readings from Last 3 Encounters:  08/19/17 77.1 kg (170 lb)  06/14/17 74.8 kg (165 lb)  04/17/17 77.1 kg (170 lb)    PHYSICAL EXAMINATION:  Physical Exam  Vitals reviewed. Constitutional: He  is oriented to person, place, and time. He appears well-developed and well-nourished. No distress.  HENT:  Head: Normocephalic and atraumatic.  Mouth/Throat: Oropharynx is clear and moist.  Eyes: Conjunctivae and EOM are normal. Pupils are equal, round, and reactive to light. No scleral icterus.  Neck: Normal range of motion. Neck supple. No JVD present. No thyromegaly present.  Cardiovascular: Normal rate, regular rhythm and intact distal pulses. Exam reveals no gallop and no friction rub.  No murmur heard. Respiratory: Effort normal and breath sounds normal. No respiratory distress. He has no wheezes. He has no rales.  GI: Soft. Bowel sounds are normal. He exhibits no distension. There is no tenderness.  Musculoskeletal: Normal range of motion. He exhibits no edema.  No arthritis, no gout  Lymphadenopathy:    He has no cervical adenopathy.  Neurological: He is alert and oriented to person, place, and time. No cranial nerve deficit.  Neurologic: Cranial nerves II-XII intact, patient states he has significant sensory deficit in his left arm and even worse in his left leg, 5/5 strength in right upper and lower extremities, patient states he is unable to lift his left arm at all, but he did sustain it raised for some period of time when it is lifted passively, as he is unable to lift his left leg, no dysarthria, no aphasia, no dysphagia, memory intact.  Skin: Skin is warm and dry. No rash noted. No erythema.  Psychiatric: He has a normal mood and affect. His behavior is normal. Judgment and thought content normal.    LABORATORY PANEL:   CBC Recent Labs  Lab 08/19/17 2135  WBC 11.8*  HGB 16.3  HCT 47.6  PLT 249   ------------------------------------------------------------------------------------------------------------------  Chemistries  Recent Labs  Lab 08/19/17 2135  NA 141  K 3.8  CL 108  CO2 20*  GLUCOSE 90  BUN 14  CREATININE 0.77  CALCIUM 9.4  AST 61*  ALT 176*   ALKPHOS 99  BILITOT 0.4   ------------------------------------------------------------------------------------------------------------------  Cardiac Enzymes Recent Labs  Lab 08/19/17 2135  TROPONINI <0.03   ------------------------------------------------------------------------------------------------------------------  RADIOLOGY:  Ct Angio Head W Or Wo Contrast  Result Date: 08/19/2017 CLINICAL DATA:  Left-sided facial, arm, and leg numbness. EXAM: CT ANGIOGRAPHY HEAD AND NECK TECHNIQUE: Multidetector CT imaging of the head and neck was performed using the standard protocol during bolus administration of intravenous contrast. Multiplanar CT image reconstructions and MIPs were obtained to evaluate the vascular anatomy. Carotid stenosis measurements (when applicable) are obtained utilizing NASCET criteria, using the distal internal carotid diameter as the denominator. CONTRAST:  75mL ISOVUE-370 IOPAMIDOL (ISOVUE-370) INJECTION 76% COMPARISON:  Noncontrast head CT earlier today FINDINGS: CTA NECK FINDINGS Aortic arch: Normal diameter and appearance.  Four vessel branching. Right carotid system: Vessels are smooth and widely patent. No atheromatous changes. Left carotid system: Vessels are smooth and widely patent. No  atheromatous changes. Vertebral arteries: No proximal right subclavian or brachiocephalic stenosis. Codominant vertebral arteries that are smooth and widely patent to the dura. Skeleton: Normal appearance Other neck: No incidental mass or inflammation. Upper chest: Negative Review of the MIP images confirms the above findings CTA HEAD FINDINGS Anterior circulation: Symmetric carotid arteries. Dominant left A1 segment with anterior communicating artery present. No branch occlusion, stenosis, or beading. Negative for aneurysm. Posterior circulation: Vertebral and basilar arteries are smooth and widely patent. Symmetric good patency of bilateral posterior cerebral arteries. Negative for  aneurysm. Venous sinuses: Patent. 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 Anatomic variants: None significant Delayed phase: Not performed in the emergent setting. Review of the MIP images confirms the above findings IMPRESSION: Normal CTA of the head and neck. Electronically Signed   By: Marnee SpringJonathon  Watts M.D.   On: 08/19/2017 23:00   Ct Angio Neck W And/or Wo Contrast  Result Date: 08/19/2017 CLINICAL DATA:  Left-sided facial, arm, and leg numbness. EXAM: CT ANGIOGRAPHY HEAD AND NECK TECHNIQUE: Multidetector CT imaging of the head and neck was performed using the standard protocol during bolus administration of intravenous contrast. Multiplanar CT image reconstructions and MIPs were obtained to evaluate the vascular anatomy. Carotid stenosis measurements (when applicable) are obtained utilizing NASCET criteria, using the distal internal carotid diameter as the denominator. CONTRAST:  75mL ISOVUE-370 IOPAMIDOL (ISOVUE-370) INJECTION 76% COMPARISON:  Noncontrast head CT earlier today FINDINGS: CTA NECK FINDINGS Aortic arch: Normal diameter and appearance.  Four vessel branching. Right carotid system: Vessels are smooth and widely patent. No atheromatous changes. Left carotid system: Vessels are smooth and widely patent. No atheromatous changes. Vertebral arteries: No proximal right subclavian or brachiocephalic stenosis. Codominant vertebral arteries that are smooth and widely patent to the dura. Skeleton: Normal appearance Other neck: No incidental mass or inflammation. Upper chest: Negative Review of the MIP images confirms the above findings CTA HEAD FINDINGS Anterior circulation: Symmetric carotid arteries. Dominant left A1 segment with anterior communicating artery present. No branch occlusion, stenosis, or beading. Negative for aneurysm. Posterior circulation: Vertebral and basilar arteries are smooth and widely patent. Symmetric good patency of bilateral posterior cerebral arteries.  Negative for aneurysm. Venous sinuses: Patent. 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 Anatomic variants: None significant Delayed phase: Not performed in the emergent setting. Review of the MIP images confirms the above findings IMPRESSION: Normal CTA of the head and neck. Electronically Signed   By: Marnee SpringJonathon  Watts M.D.   On: 08/19/2017 23:00   Mr Brain Wo Contrast  Result Date: 08/20/2017 CLINICAL DATA:  Acute onset numbness and LEFT-sided weakness at 2030 hours [facility]. Receive tPA 2-1/2 weeks ago for stroke, negative subsequent workup. Assess stroke. EXAM: MRI HEAD WITHOUT CONTRAST TECHNIQUE: Multiplanar, multiecho pulse sequences of the brain and surrounding structures were obtained without intravenous contrast. COMPARISON:  CT HEAD August 19, 2017 at 2121 hours. MRI of the head report dated August 02, 2017, images are not available for direct comparison. FINDINGS: BRAIN: No reduced diffusion to suggest acute ischemia. No susceptibility artifact to suggest hemorrhage. The ventricles and sulci are normal for patient's age. LEFT cerebellar developmental venous anomaly better demonstrated on prior imaging. No suspicious parenchymal signal, mass or mass effect. No abnormal extra-axial fluid collections. VASCULAR: Normal major intracranial vascular flow voids present at skull base. SKULL AND UPPER CERVICAL SPINE: No abnormal sellar expansion. No suspicious calvarial bone marrow signal. Craniocervical junction maintained. SINUSES/ORBITS: The mastoid air-cells and included paranasal sinuses are well-aerated. The  included ocular globes and orbital contents are non-suspicious. OTHER: None. IMPRESSION: 1. No acute intracranial process. 2. LEFT cerebellar DVA, otherwise unremarkable noncontrast MRI head. Electronically Signed   By: Awilda Metroourtnay  Bloomer M.D.   On: 08/20/2017 00:00   Ct Head Code Stroke Wo Contrast  Result Date: 08/19/2017 CLINICAL DATA:  Code stroke.  Ataxia, stroke suspected. Slurred speech and left-sided weakness. EXAM: CT HEAD WITHOUT CONTRAST TECHNIQUE: Contiguous axial images were obtained from the base of the skull through the vertex without intravenous contrast. COMPARISON:  None. FINDINGS: Brain: No evidence of infarction, hemorrhage, hydrocephalus, extra-axial collection or mass lesion/mass effect. Vascular: No asymmetric vessel density. Skull: Normal. Negative for fracture or focal lesion. Sinuses/Orbits: Negative Other: These results were called by telephone at the time of interpretation on 08/19/2017 at 9:35 pm to Dr. Dorothea GlassmanPAUL MALINDA , who verbally acknowledged these results. ASPECTS [facility]([place] Stroke Program Early CT Score) -left hemisphere - Ganglionic level infarction (caudate, lentiform nuclei, internal capsule, insula, M1-M3 cortex): 7 - Supraganglionic infarction (M4-M6 cortex): 3 Total score (0-10 with 10 being normal): 10 IMPRESSION: Negative head CT. Electronically Signed   By: Marnee SpringJonathon  Watts M.D.   On: 08/19/2017 21:36    EKG:   Orders placed or performed during the hospital encounter of 08/19/17  . ED EKG  . ED EKG  . ED EKG  . ED EKG  . EKG 12-Lead  . EKG 12-Lead  . EKG 12-Lead  . EKG 12-Lead    IMPRESSION AND PLAN:  Principal Problem:   Left-sided weakness -physical exam inconsistent with acute stroke pathology, see above.  We will get an MRI to evaluate for stroke, though his symptom profile is highly suspicious.  We will get a neurology consult.  If MRI is negative, might consider psychiatry consult. Active Problems:   History of stroke -this is listed on his past medical history, and he [facility] enough weeks ago for similar symptom profile.  However, chart review shows that MRI at that time did not show a stroke   GERD (gastroesophageal reflux disease) -home dose PPI  All the records are reviewed and case discussed with ED provider. Management plans discussed with the patient and/or  family.  DVT PROPHYLAXIS: SubQ lovenox  GI PROPHYLAXIS: PPI  ADMISSION STATUS: Observation  CODE STATUS: Full Code Status History    This patient does not have a recorded code status. Please follow your organizational policy for patients in this situation.      TOTAL TIME TAKING CARE OF THIS PATIENT: 40 minutes.   Darrielle Pflieger FIELDING 08/20/2017, 12:12 AM  [facility]  Office  [phone]  CC: Primary care physician; Patient, No Pcp Per  Note:  This document was prepared using Dragon voice recognition software and may include unintentional dictation errors.

## 2017-08-20 NOTE — ED Notes (Signed)
Pt presents today via POV with wife and child, for l/s weakness. Pt states it started about 845 pm Pt [facility] and received tPA. Code stroke called.

## 2017-08-20 NOTE — Progress Notes (Signed)
Pt report he is having trouble moving his arm, however, pt was able to turn off the lights when without thinking about it. Later in the shift, he was speaking on his phone and this nurse needed to get his vitals and he quickly turned on his back, prior he seem to have difficulty turning. Oncoming nurse is aware.

## 2017-08-20 NOTE — Progress Notes (Signed)
SLP Cancellation Note  Patient Details Name: Luke D Stranahan Jr. MRN: [id number] DOB: 01/01/1991   Cancelled treatment:       Reason Eval/Treat Not Completed: SLP screened, no needs identified, will sign off(chart reviewed; consulted NSG then met w/ pt)  Pt denied any difficulty swallowing and is currently on a regular diet; tolerates swallowing pills w/ water per NSG. Pt conversed at conversational level w/out deficits noted; pt and family denied any speech-language deficits. Pt moving about in the bed and talking on his cell phone independently.  No further skilled ST services indicated as pt appears at his baseline. Pt agreed. NSG to reconsult if any change in status.     Katherine Watson, MS, CCC-SLP Watson,Katherine 08/20/2017, 12:33 PM   

## 2017-08-20 NOTE — Consult Note (Signed)
Referring Physician: Sherryll BurgerShah    Chief Complaint: Left sided numbness  HPI: Luke ChurchKenneth D Fasching Jr. is an 26 y.o. male admitted with left sided numbness.  [facility].  Patient received tPA and had a full work up including MRI that was unremarkable.  Patient reports all symptoms had resolved by Wednesday of last week.  On Tuesday of this week began to notice numbness in his left foot again and on yesterday noted numbness on the left side of his face.  Patient presented to the ED and was admitted for further evaluation.  Initial NIHSS of 5.  Date last known well: Date: 08/17/2017 Time last known well: Unable to determine tPA Given: No: Outside time window  Past Medical History:  Diagnosis Date  . CVA (cerebral vascular accident) (HCC)   . GERD (gastroesophageal reflux disease)     Past Surgical History:  Procedure Laterality Date  . NO PAST SURGERIES      Family History  Problem Relation Age of Onset  . Diabetes Mother   . Heart attack Mother   . Crohn's disease Father   . Hypertension Father    Social History:  reports that he has been smoking cigarettes.  He has been smoking about 0.50 packs per day. He has quit using smokeless tobacco. He reports that he drinks about 1.2 - 1.8 oz of alcohol per week. His drug history is not on file.  Allergies: No Known Allergies  Medications:  I have reviewed the patient's current medications. Prior to Admission:  Medications Prior to Admission  Medication Sig Dispense Refill Last Dose  . aspirin EC 81 MG tablet Take 81 mg by mouth daily.     Marland Kitchen. atorvastatin (LIPITOR) 80 MG tablet Take 80 mg by mouth daily.     Marland Kitchen. esomeprazole (NEXIUM) 20 MG capsule Take 20 mg by mouth daily at 12 noon.     . brompheniramine-pseudoephedrine-DM 30-2-10 MG/5ML syrup Take 10 mLs by mouth 4 (four) times daily as needed. (Patient not taking: Reported on 08/19/2017) 200 mL 0 Not Taking at Unknown time  .  HYDROcodone-acetaminophen (NORCO) 5-325 MG tablet Take 1 tablet by mouth every 6 (six) hours as needed for severe pain. (Patient not taking: Reported on 08/19/2017) 9 tablet 0 Not Taking at Unknown time  . ibuprofen (ADVIL,MOTRIN) 600 MG tablet Take 1 tablet (600 mg total) by mouth every 8 (eight) hours as needed. (Patient not taking: Reported on 08/19/2017) 30 tablet 0 Not Taking at Unknown time   Scheduled: . aspirin EC  81 mg Oral Daily  . atorvastatin  80 mg Oral Daily  . enoxaparin (LOVENOX) injection  40 mg Subcutaneous Q24H  . pantoprazole  40 mg Oral Daily    ROS: History obtained from the patient  General ROS: negative for - chills, fatigue, fever, night sweats, weight gain or weight loss Psychological ROS: negative for - behavioral disorder, hallucinations, memory difficulties, mood swings or suicidal ideation Ophthalmic ROS: negative for - blurry vision, double vision, eye pain or loss of vision ENT ROS: negative for - epistaxis, nasal discharge, oral lesions, sore throat, tinnitus or vertigo Allergy and Immunology ROS: negative for - hives or itchy/watery eyes Hematological and Lymphatic ROS: negative for - bleeding problems, bruising or swollen lymph nodes Endocrine ROS: negative for - galactorrhea, hair pattern changes, polydipsia/polyuria or temperature intolerance Respiratory ROS: negative for - cough, hemoptysis, shortness of breath or wheezing Cardiovascular ROS: negative for - chest pain, dyspnea  on exertion, edema or irregular heartbeat Gastrointestinal ROS: negative for - abdominal pain, diarrhea, hematemesis, nausea/vomiting or stool incontinence Genito-Urinary ROS: negative for - dysuria, hematuria, incontinence or urinary frequency/urgency Musculoskeletal ROS: negative for - joint swelling or muscular weakness Neurological ROS: as noted in HPI Dermatological ROS: negative for rash and skin lesion changes  Physical Examination: Blood pressure (!) 107/59, pulse 60,  temperature 98.1 F (36.7 C), temperature source Oral, resp. rate 15, height 5\' 7"  (1.702 m), weight 77.5 kg (170 lb 12.8 oz), SpO2 99 %.  HEENT-  Normocephalic, no lesions, without obvious abnormality.  Normal external eye and conjunctiva.  Normal TM's bilaterally.  Normal auditory canals and external ears. Normal external nose, mucus membranes and septum.  Normal pharynx. Cardiovascular- S1, S2 normal, pulses palpable throughout   Lungs- chest clear, no wheezing, rales, normal symmetric air entry Abdomen- soft, non-tender; bowel sounds normal; no masses,  no organomegaly Extremities- no edema Lymph-no adenopathy palpable Musculoskeletal-no joint tenderness, deformity or swelling Skin-warm and dry, no hyperpigmentation, vitiligo, or suspicious lesions  Neurological Examination   Mental Status: Alert, oriented, thought content appropriate.  Speech fluent without evidence of aphasia.  Able to follow 3 step commands without difficulty. Cranial Nerves: II: Discs flat bilaterally; Visual fields grossly normal, pupils equal, round, reactive to light and accommodation III,IV, VI: ptosis not present, extra-ocular motions intact bilaterally V,VII: smile symmetric, facial light touch sensation decreased on the left VIII: hearing normal bilaterally IX,X: gag reflex present XI: bilateral shoulder shrug XII: midline tongue extension Motor: Right : Upper extremity   5/5    Left:     Upper extremity   5/5  Lower extremity   5/5     Lower extremity   4/5 with decreased effort Tone and bulk:normal tone throughout; no atrophy noted Sensory: Pinprick and light touch decreased in a patchy distribution on the LLE Deep Tendon Reflexes: 2+ and symmetric with absent AJ's bilaterally Plantars: Right: downgoing   Left: downgoing Cerebellar: Normal finger-to-nose and normal heel-to-shin testing bilaterally Gait: not tested due to safety concerns but patient was up in the bathroom unassisted earlier in the  day    Laboratory Studies:  Basic Metabolic Panel: Recent Labs  Lab 08/19/17 2135 08/20/17 0516  NA 141  --   K 3.8  --   CL 108  --   CO2 20*  --   GLUCOSE 90  --   BUN 14  --   CREATININE 0.77 0.66  CALCIUM 9.4  --     Liver Function Tests: Recent Labs  Lab 08/19/17 2135  AST 61*  ALT 176*  ALKPHOS 99  BILITOT 0.4  PROT 7.5  ALBUMIN 4.6   No results for input(s): LIPASE, AMYLASE in the last 168 hours. No results for input(s): AMMONIA in the last 168 hours.  CBC: Recent Labs  Lab 08/19/17 2135 08/20/17 0516  WBC 11.8* 9.4  NEUTROABS 7.5*  --   HGB 16.3 15.1  HCT 47.6 43.8  MCV 81.5 82.1  PLT 249 225    Cardiac Enzymes: Recent Labs  Lab 08/19/17 2135  TROPONINI <0.03    BNP: Invalid input(s): POCBNP  CBG: No results for input(s): GLUCAP in the last 168 hours.  Microbiology: Results for orders placed or performed during the hospital encounter of 12/25/16  Chlamydia/NGC rt PCR [facility] only)     Status: None   Collection Time: 12/25/16  8:30 PM  Result Value Ref Range Status   Specimen source GC/Chlam URINE, RANDOM  Final  Chlamydia Tr NOT DETECTED NOT DETECTED Final   N gonorrhoeae NOT DETECTED NOT DETECTED Final    Comment: (NOTE) 100  This methodology has not been evaluated in pregnant women or in 200  patients with a history of hysterectomy. 300 400  This methodology will not be performed on patients less than 1014  years of age.   Urine culture     Status: Abnormal   Collection Time: 12/25/16  8:30 PM  Result Value Ref Range Status   Specimen Description URINE, RANDOM  Final   Special Requests Normal  Final   Culture MULTIPLE SPECIES PRESENT, SUGGEST RECOLLECTION (A)  Final   Report Status 12/27/2016 FINAL  Final    Coagulation Studies: Recent Labs    08/19/17 2135  LABPROT 12.5  INR 0.94    Urinalysis:  Recent Labs  Lab 08/20/17 0253  COLORURINE YELLOW*  LABSPEC >1.046*  PHURINE 5.0  GLUCOSEU NEGATIVE  HGBUR NEGATIVE   BILIRUBINUR NEGATIVE  KETONESUR NEGATIVE  PROTEINUR NEGATIVE  NITRITE NEGATIVE  LEUKOCYTESUR NEGATIVE    Lipid Panel:    Component Value Date/Time   CHOL 77 08/20/2017 0516   TRIG 28 08/20/2017 0516   HDL 28 (L) 08/20/2017 0516   CHOLHDL 2.8 08/20/2017 0516   VLDL 6 08/20/2017 0516   LDLCALC 43 08/20/2017 0516    HgbA1C:  Lab Results  Component Value Date   HGBA1C 4.9 08/20/2017    Urine Drug Screen:      Component Value Date/Time   LABOPIA NONE DETECTED 08/20/2017 0253   COCAINSCRNUR NONE DETECTED 08/20/2017 0253   LABBENZ NONE DETECTED 08/20/2017 0253   AMPHETMU NONE DETECTED 08/20/2017 0253   THCU NONE DETECTED 08/20/2017 0253   LABBARB NONE DETECTED 08/20/2017 0253    Alcohol Level:  Recent Labs  Lab 08/19/17 2136  ETH 33*    Other results: EKG: sinus rhythm at 84 bpm.  Imaging: Ct Angio Head W Or Wo Contrast  Result Date: 08/19/2017 CLINICAL DATA:  Left-sided facial, arm, and leg numbness. EXAM: CT ANGIOGRAPHY HEAD AND NECK TECHNIQUE: Multidetector CT imaging of the head and neck was performed using the standard protocol during bolus administration of intravenous contrast. Multiplanar CT image reconstructions and MIPs were obtained to evaluate the vascular anatomy. Carotid stenosis measurements (when applicable) are obtained utilizing NASCET criteria, using the distal internal carotid diameter as the denominator. CONTRAST:  75mL ISOVUE-370 IOPAMIDOL (ISOVUE-370) INJECTION 76% COMPARISON:  Noncontrast head CT earlier today FINDINGS: CTA NECK FINDINGS Aortic arch: Normal diameter and appearance.  Four vessel branching. Right carotid system: Vessels are smooth and widely patent. No atheromatous changes. Left carotid system: Vessels are smooth and widely patent. No atheromatous changes. Vertebral arteries: No proximal right subclavian or brachiocephalic stenosis. Codominant vertebral arteries that are smooth and widely patent to the dura. Skeleton: Normal appearance  Other neck: No incidental mass or inflammation. Upper chest: Negative Review of the MIP images confirms the above findings CTA HEAD FINDINGS Anterior circulation: Symmetric carotid arteries. Dominant left A1 segment with anterior communicating artery present. No branch occlusion, stenosis, or beading. Negative for aneurysm. Posterior circulation: Vertebral and basilar arteries are smooth and widely patent. Symmetric good patency of bilateral posterior cerebral arteries. Negative for aneurysm. Venous sinuses: Patent. 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 Anatomic variants: None significant Delayed phase: Not performed in the emergent setting. Review of the MIP images confirms the above findings IMPRESSION: Normal CTA of the head and neck. Electronically Signed   By: Kathrynn DuckingJonathon  Watts M.D.  On: 08/19/2017 23:00   Ct Angio Neck W And/or Wo Contrast  Result Date: 08/19/2017 CLINICAL DATA:  Left-sided facial, arm, and leg numbness. EXAM: CT ANGIOGRAPHY HEAD AND NECK TECHNIQUE: Multidetector CT imaging of the head and neck was performed using the standard protocol during bolus administration of intravenous contrast. Multiplanar CT image reconstructions and MIPs were obtained to evaluate the vascular anatomy. Carotid stenosis measurements (when applicable) are obtained utilizing NASCET criteria, using the distal internal carotid diameter as the denominator. CONTRAST:  75mL ISOVUE-370 IOPAMIDOL (ISOVUE-370) INJECTION 76% COMPARISON:  Noncontrast head CT earlier today FINDINGS: CTA NECK FINDINGS Aortic arch: Normal diameter and appearance.  Four vessel branching. Right carotid system: Vessels are smooth and widely patent. No atheromatous changes. Left carotid system: Vessels are smooth and widely patent. No atheromatous changes. Vertebral arteries: No proximal right subclavian or brachiocephalic stenosis. Codominant vertebral arteries that are smooth and widely patent to the dura. Skeleton:  Normal appearance Other neck: No incidental mass or inflammation. Upper chest: Negative Review of the MIP images confirms the above findings CTA HEAD FINDINGS Anterior circulation: Symmetric carotid arteries. Dominant left A1 segment with anterior communicating artery present. No branch occlusion, stenosis, or beading. Negative for aneurysm. Posterior circulation: Vertebral and basilar arteries are smooth and widely patent. Symmetric good patency of bilateral posterior cerebral arteries. Negative for aneurysm. Venous sinuses: Patent. 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 Anatomic variants: None significant Delayed phase: Not performed in the emergent setting. Review of the MIP images confirms the above findings IMPRESSION: Normal CTA of the head and neck. Electronically Signed   By: Marnee Spring M.D.   On: 08/19/2017 23:00   Mr Brain Wo Contrast  Result Date: 08/20/2017 CLINICAL DATA:  Acute onset numbness and LEFT-sided weakness at 2030 hours while driving at [facility]. Receive tPA 2-1/2 weeks ago for stroke, negative subsequent workup. Assess stroke. EXAM: MRI HEAD WITHOUT CONTRAST TECHNIQUE: Multiplanar, multiecho pulse sequences of the brain and surrounding structures were obtained without intravenous contrast. COMPARISON:  CT HEAD August 19, 2017 at 2121 hours. MRI of the head report dated August 02, 2017, images are not available for direct comparison. FINDINGS: BRAIN: No reduced diffusion to suggest acute ischemia. No susceptibility artifact to suggest hemorrhage. The ventricles and sulci are normal for patient's age. LEFT cerebellar developmental venous anomaly better demonstrated on prior imaging. No suspicious parenchymal signal, mass or mass effect. No abnormal extra-axial fluid collections. VASCULAR: Normal major intracranial vascular flow voids present at skull base. SKULL AND UPPER CERVICAL SPINE: No abnormal sellar expansion. No suspicious calvarial bone marrow  signal. Craniocervical junction maintained. SINUSES/ORBITS: The mastoid air-cells and included paranasal sinuses are well-aerated. The included ocular globes and orbital contents are non-suspicious. OTHER: None. IMPRESSION: 1. No acute intracranial process. 2. LEFT cerebellar DVA, otherwise unremarkable noncontrast MRI head. Electronically Signed   By: Awilda Metro M.D.   On: 08/20/2017 00:00   Ct Head Code Stroke Wo Contrast  Result Date: 08/19/2017 CLINICAL DATA:  Code stroke. Ataxia, stroke suspected. Slurred speech and left-sided weakness. EXAM: CT HEAD WITHOUT CONTRAST TECHNIQUE: Contiguous axial images were obtained from the base of the skull through the vertex without intravenous contrast. COMPARISON:  None. FINDINGS: Brain: No evidence of infarction, hemorrhage, hydrocephalus, extra-axial collection or mass lesion/mass effect. Vascular: No asymmetric vessel density. Skull: Normal. Negative for fracture or focal lesion. Sinuses/Orbits: Negative Other: These results were called by telephone at the time of interpretation on 08/19/2017 at 9:35 pm to Dr. Dorothea Glassman , who verbally acknowledged  these results. ASPECTS [facility] Stroke Program Early CT Score) -left hemisphere - Ganglionic level infarction (caudate, lentiform nuclei, internal capsule, insula, M1-M3 cortex): 7 - Supraganglionic infarction (M4-M6 cortex): 3 Total score (0-10 with 10 being normal): 10 IMPRESSION: Negative head CT. Electronically Signed   By: Marnee Spring M.D.   On: 08/19/2017 21:36    Assessment: 26 y.o. male presenting with complaints of left-sided numbness.  Exam is somewhat inconsistent with patient's functional capacity within the room.  Head CT reviewed and shows no acute changes.  CTA of the head and neck performed and were unremarkable as well.  MRI of the brain repeat it was reviewed and again showed no acute intracranial processes.  Patient has recently had a stroke workup.  It was unremarkable and included  hypercoagulable screening.  Patient is on aspirin a day and a statin.  Would continue current regimen.  Stroke Risk Factors - smoking  Plan: 1. PT consult, OT consult, Speech consult 2. Prophylactic therapy-Continue ASA 3. Telemetry monitoring 4. Frequent neuro checks 5. Patient keep Artie scheduled outpatient evaluation by neurology. 6. Smoking cessation counseling. 7. B12, TSH  Case discussed with Dr. Dellia Nims, MD Neurology [phone] 08/20/2017, 1:24 PM

## 2017-08-20 NOTE — Plan of Care (Signed)
Pt report weakness in left side and is currently weak on the left side. Pt BP was 88/42 and HR was 50s this am. MD notified and 1L bolus ordered and administered. Pt is asymptomatic currently. Will recheck vitals after bolus. No other signs of distress noted. Will continue to monitor.

## 2017-08-21 LAB — HIV ANTIBODY (ROUTINE TESTING W REFLEX): HIV Screen 4th Generation wRfx: NONREACTIVE

## 2017-08-21 NOTE — Discharge Summary (Signed)
[facility] [place] [facility]   PATIENT NAME: Luke GreaserKenneth Mcelwain    MR#:  [id number]  DATE OF BIRTH:  08/22/1991  DATE OF ADMISSION:  08/19/2017   ADMITTING PHYSICIAN: Oralia Manisavid Willis, MD  DATE OF DISCHARGE: 08/20/2017  4:05 PM  PRIMARY CARE PHYSICIAN: Patient, No Pcp Per   ADMISSION DIAGNOSIS:  Paresthesias [R20.2] Weakness [R53.1] DISCHARGE DIAGNOSIS:  Principal Problem:   Left-sided weakness Active Problems:   GERD (gastroesophageal reflux disease)   History of stroke  SECONDARY DIAGNOSIS:   Past Medical History:  Diagnosis Date  . CVA (cerebral vascular accident) (HCC)   . GERD (gastroesophageal reflux disease)    HOSPITAL COURSE:  26 y.o. male presenting with complaints of left-sided numbness.  * Weakness: Exam is somewhat inconsistent with patient's functional capacity within the room.  - Head CT shows no acute changes.  - CTA of the head and neck were unremarkable as well.  - MRI of the brain repeat it was reviewed and again showed no acute intracranial processes. - Patient has recently had a stroke workup.  It was unremarkable and included hypercoagulable screening.  Patient is on aspirin a day and a statin.  Would continue current regimen. - No needs identified on PT, OT, Speech eval - outpatient evaluation by neurology. - Smoking cessation counseling. - Normal B12, TSH DISCHARGE CONDITIONS:  stable CONSULTS OBTAINED:  Treatment Team:  Thana Farreynolds, Leslie, MD DRUG ALLERGIES:  No Known Allergies DISCHARGE MEDICATIONS:   Allergies as of 08/20/2017   No Known Allergies     Medication List    TAKE these medications   aspirin EC 81 MG tablet Take 81 mg by mouth daily.   atorvastatin 80 MG tablet Commonly known as:  LIPITOR Take 80 mg by mouth daily.   brompheniramine-pseudoephedrine-DM 30-2-10 MG/5ML syrup Take 10 mLs by mouth 4 (four) times daily as needed.   esomeprazole 20 MG capsule Commonly known as:  NEXIUM Take 20 mg by  mouth daily at 12 noon.   HYDROcodone-acetaminophen 5-325 MG tablet Commonly known as:  NORCO Take 1 tablet by mouth every 6 (six) hours as needed for severe pain.   ibuprofen 600 MG tablet Commonly known as:  ADVIL,MOTRIN Take 1 tablet (600 mg total) by mouth every 8 (eight) hours as needed.        DISCHARGE INSTRUCTIONS:   DIET:  Regular diet DISCHARGE CONDITION:  Good ACTIVITY:  Activity as tolerated OXYGEN:  Home Oxygen: No.  Oxygen Delivery: room air DISCHARGE LOCATION:  home   If you experience worsening of your admission symptoms, develop shortness of breath, life threatening emergency, suicidal or homicidal thoughts you must seek medical attention immediately by calling 911 or calling your MD immediately  if symptoms less severe.  You Must read complete instructions/literature along with all the possible adverse reactions/side effects for all the Medicines you take and that have been prescribed to you. Take any new Medicines after you have completely understood and accpet all the possible adverse reactions/side effects.   Please note  You were cared for by a hospitalist during your hospital stay. If you have any questions about your discharge medications or the care you received while you were in the hospital after you are discharged, you can call the unit and asked to speak with the hospitalist on call if the hospitalist that took care of you is not available. Once you are discharged, your primary care physician will handle any further medical issues. Please note that NO REFILLS for  any discharge medications will be authorized once you are discharged, as it is imperative that you return to your primary care physician (or establish a relationship with a primary care physician if you do not have one) for your aftercare needs so that they can reassess your need for medications and monitor your lab values.    On the day of Discharge:  VITAL SIGNS:  Blood pressure 115/66,  pulse 73, temperature 98.1 F (36.7 C), temperature source Oral, resp. rate 15, height 5\' 7"  (1.702 m), weight 77.5 kg (170 lb 12.8 oz ), SpO2 99 %. PHYSICAL EXAMINATION:  GENERAL:  26 y.o.-year-old patient lying in the bed with no acute distress.  EYES: Pupils equal, round, reactive to light and accommodation. No scleral icterus. Extraocular muscles intact.  HEENT: Head atraumatic, normocephalic. Oropharynx and nasopharynx clear.  NECK:  Supple, no jugular venous distention. No thyroid enlargement, no tenderness.  LUNGS: Normal breath sounds bilaterally, no wheezing, rales,rhonchi or crepitation. No use of accessory muscles of respiration.  CARDIOVASCULAR: S1, S2 normal. No murmurs, rubs, or gallops.  ABDOMEN: Soft, non-tender, non-distended. Bowel sounds present. No organomegaly or mass.  EXTREMITIES: No pedal edema, cyanosis, or clubbing.  NEUROLOGIC: Cranial nerves II through XII are intact. Muscle strength 5/5 in all extremities. Sensation intact. Gait not checked.  PSYCHIATRIC: The patient is alert and oriented x 3.  SKIN: No obvious rash, lesion, or ulcer.  DATA REVIEW:   CBC Recent Labs  Lab 08/20/17 0516  WBC 9.4  HGB 15.1  HCT 43.8  PLT 225    Chemistries  Recent Labs  Lab 08/19/17 2135 08/20/17 0516  NA 141  --   K 3.8  --   CL 108  --   CO2 20*  --   GLUCOSE 90  --   BUN 14  --   CREATININE 0.77 0.66  CALCIUM 9.4  --   AST 61*  --   ALT 176*  --   ALKPHOS 99  --   BILITOT 0.4  --      Follow-up Information    Lonell FaceShah, Hemang K, MD. Schedule an appointment as soon as possible for a visit in 1 week(s).   Specialty:  Neurology Contact information: [address] [facility] [place] [address] [phone]           Management plans discussed with the patient, family and they are in agreement.  CODE STATUS: Prior   TOTAL TIME TAKING CARE OF THIS PATIENT: 45 minutes.    Delfino LovettVipul Blakley Michna M.D on 08/21/2017 at 9:20 AM  Between  7am to 6pm - Pager - [phone]  After 6pm go to [email or web address] - Social research officer, governmentpassword [email or web address]  [facility]  Office  [phone]  CC: Primary care physician; Patient, No Pcp Per   Note: This dictation was prepared with Dragon dictation along with smaller phrase technology. Any transcriptional errors that result from this process are unintentional.

## 2017-09-01 ENCOUNTER — Other Ambulatory Visit: Payer: Self-pay

## 2017-09-01 ENCOUNTER — Emergency Department
Admission: EM | Admit: 2017-09-01 | Discharge: 2017-09-02 | Disposition: A | Discharge status: Home / Self Care | Payer: Self-pay | Attending: Emergency Medicine | Admitting: Emergency Medicine

## 2017-09-01 DIAGNOSIS — F17210 Nicotine dependence, cigarettes, uncomplicated: Secondary | ICD-10-CM | POA: Insufficient documentation

## 2017-09-01 DIAGNOSIS — Z8673 Personal history of transient ischemic attack (TIA), and cerebral infarction without residual deficits: Secondary | ICD-10-CM | POA: Insufficient documentation

## 2017-09-01 DIAGNOSIS — M545 Low back pain, unspecified: Secondary | ICD-10-CM

## 2017-09-01 DIAGNOSIS — Z7982 Long term (current) use of aspirin: Secondary | ICD-10-CM | POA: Insufficient documentation

## 2017-09-01 DIAGNOSIS — Z79899 Other long term (current) drug therapy: Secondary | ICD-10-CM | POA: Insufficient documentation

## 2017-09-01 DIAGNOSIS — R1032 Left lower quadrant pain: Secondary | ICD-10-CM | POA: Insufficient documentation

## 2017-09-01 LAB — URINE DRUG SCREEN, QUALITATIVE (ARMC ONLY)
AMPHETAMINES, UR SCREEN: NOT DETECTED
BARBITURATES, UR SCREEN: NOT DETECTED
BENZODIAZEPINE, UR SCRN: NOT DETECTED
Cannabinoid 50 Ng, Ur ~~LOC~~: NOT DETECTED
Cocaine Metabolite,Ur ~~LOC~~: NOT DETECTED
MDMA (Ecstasy)Ur Screen: NOT DETECTED
METHADONE SCREEN, URINE: NOT DETECTED
Opiate, Ur Screen: NOT DETECTED
Phencyclidine (PCP) Ur S: NOT DETECTED
TRICYCLIC, UR SCREEN: NOT DETECTED

## 2017-09-01 LAB — COMPREHENSIVE METABOLIC PANEL
ALT: 448 U/L — ABNORMAL HIGH (ref 17–63)
ANION GAP: 9 (ref 5–15)
AST: 185 U/L — AB (ref 15–41)
Albumin: 4.5 g/dL (ref 3.5–5.0)
Alkaline Phosphatase: 100 U/L (ref 38–126)
BILIRUBIN TOTAL: 0.4 mg/dL (ref 0.3–1.2)
BUN: 15 mg/dL (ref 6–20)
CHLORIDE: 106 mmol/L (ref 101–111)
CO2: 25 mmol/L (ref 22–32)
Calcium: 9.5 mg/dL (ref 8.9–10.3)
Creatinine, Ser: 0.93 mg/dL (ref 0.61–1.24)
Glucose, Bld: 110 mg/dL — ABNORMAL HIGH (ref 65–99)
POTASSIUM: 4 mmol/L (ref 3.5–5.1)
Sodium: 140 mmol/L (ref 135–145)
TOTAL PROTEIN: 7.6 g/dL (ref 6.5–8.1)

## 2017-09-01 LAB — CBC
HEMATOCRIT: 48.9 % (ref 40.0–52.0)
Hemoglobin: 16.6 g/dL (ref 13.0–18.0)
MCH: 28.1 pg (ref 26.0–34.0)
MCHC: 34 g/dL (ref 32.0–36.0)
MCV: 82.8 fL (ref 80.0–100.0)
PLATELETS: 243 10*3/uL (ref 150–440)
RBC: 5.9 MIL/uL (ref 4.40–5.90)
RDW: 13.7 % (ref 11.5–14.5)
WBC: 9.1 10*3/uL (ref 3.8–10.6)

## 2017-09-01 LAB — TROPONIN I

## 2017-09-01 LAB — LIPASE, BLOOD: LIPASE: 22 U/L (ref 11–51)

## 2017-09-01 MED ORDER — MORPHINE SULFATE (PF) 4 MG/ML IV SOLN
4.0000 mg | Freq: Once | INTRAVENOUS | Status: AC
  Administered 2017-09-01: 4 mg via INTRAVENOUS
  Filled 2017-09-01: qty 1

## 2017-09-01 MED ORDER — ONDANSETRON HCL 4 MG/2ML IJ SOLN
4.0000 mg | Freq: Once | INTRAMUSCULAR | Status: AC
  Administered 2017-09-01: 4 mg via INTRAVENOUS
  Filled 2017-09-01: qty 2

## 2017-09-01 NOTE — ED Triage Notes (Signed)
Pt in with co left sided abd pain, radiates to back. Started to lower abd but had moved to luq. States also had syncopal episode at home,

## 2017-09-01 NOTE — ED Provider Notes (Signed)
[facility] Emergency Department Provider Note  ____________________________________________   First MD Initiated Contact with Patient 09/01/17 2330     (approximate)  I have reviewed the triage vital signs and the nursing notes.   HISTORY  Chief Complaint Abdominal Pain    HPI Luke Conway. is a 26 y.o. male who comes to the emergency department with gradual onset left flank pain radiating around his upper abdomen and his left lower quadrant that began insidiously earlier today while lying in bed.  The pain is been constant ever since it began but it does come in waves.  He has no history of abdominal surgeries.  Pain seems to be somewhat worse when sitting up or moving around denies dysuria frequency or hesitancy.  He denies numbness or weakness.  Patient does have a complex past medical history suffering an acute ischemic stroke and is status post alteplase treatment at the [facility] [place] at 1 month ago.  Past Medical History:  Diagnosis Date  . CVA (cerebral vascular accident) (HCC)   . GERD (gastroesophageal reflux disease)     Patient Active Problem List   Diagnosis Date Noted  . Left-sided weakness 08/19/2017  . GERD (gastroesophageal reflux disease) 08/19/2017  . History of stroke 08/19/2017    Past Surgical History:  Procedure Laterality Date  . NO PAST SURGERIES      Prior to Admission medications   Medication Sig Start Date End Date Taking? Authorizing Provider  aspirin EC 81 MG tablet Take 81 mg by mouth daily.    [provider]  atorvastatin (LIPITOR) 80 MG tablet Take 80 mg by mouth daily.    [provider]  brompheniramine-pseudoephedrine-DM 30-2-10 MG/5ML syrup Take 10 mLs by mouth 4 (four) times daily as needed. Patient not taking: Reported on 08/19/2017 04/17/17   [name], PA-C  esomeprazole (NEXIUM) 20 MG capsule Take 20 mg by mouth daily at 12 noon.    [provider]    HYDROcodone-acetaminophen (NORCO) 5-325 MG tablet Take 1 tablet by mouth every 6 (six) hours as needed for severe pain. Patient not taking: Reported on 08/19/2017 06/14/17   [name], MD  ibuprofen (ADVIL,MOTRIN) 600 MG tablet Take 1 tablet (600 mg total) by mouth every 8 (eight) hours as needed. Patient not taking: Reported on 08/19/2017 06/14/17   [name], MD  methocarbamol (ROBAXIN) 500 MG tablet Take 1 tablet (500 mg total) by mouth 4 (four) times daily. 09/02/17   [name], MD    Allergies Patient has no known allergies.  Family History  Problem Relation Age of Onset  . Diabetes Mother   . Heart attack Mother   . Crohn's disease Father   . Hypertension Father     Social History Social History   Tobacco Use  . Smoking status: Current Every Day Smoker    Packs/day: 0.50    Types: Cigarettes  . Smokeless tobacco: Former Engineer, water Use Topics  . Alcohol use: Yes    Alcohol/week: 1.2 - 1.8 oz    Types: 2 - 3 Cans of beer per week    Comment: twice per week  . Drug use: Not on file    Review of Systems Constitutional: No fever/chills Eyes: No visual changes. ENT: No sore throat. Cardiovascular: Positive for chest pain. Respiratory: Denies shortness of breath. Gastrointestinal: Positive for abdominal pain.  No nausea, no vomiting.  No diarrhea.  No constipation. Genitourinary: Negative for dysuria. Musculoskeletal: Positive for back  pain. Skin: Negative for rash. Neurological: Negative for headaches, focal weakness or numbness.   ____________________________________________   PHYSICAL EXAM:  VITAL SIGNS: ED Triage Vitals  Enc Vitals Group     BP 09/01/17 2028 114/63     Pulse Rate 09/01/17 2028 83     Resp 09/01/17 2028 20     Temp 09/01/17 2028 98.2 F (36.8 C)     Temp Source 09/01/17 2028 Oral     SpO2 09/01/17 2028 100 %     Weight 09/01/17 2029 175 lb (79.4 kg)     Height 09/01/17 2029 5\' 6"  (1.676 m)     Head Circumference  --      Peak Flow --      Pain Score 09/01/17 2027 8     Pain Loc --      Pain Edu? --      Excl. in GC? --     Constitutional: Alert and oriented x4 somewhat stoic nontoxic no diaphoresis speaks in full clear sentences Eyes: PERRL EOMI. Head: Atraumatic. Nose: No congestion/rhinnorhea. Mouth/Throat: No trismus Neck: No stridor.   Cardiovascular: Normal rate, regular rhythm. Grossly normal heart sounds.  Good peripheral circulation. Respiratory: Normal respiratory effort.  No retractions. Lungs CTAB and moving good air Gastrointestinal: Soft mild left greater than right lower quadrant tenderness with no rebound or guarding no peritonitis Musculoskeletal: No lower extremity edema   Neurologic:  Normal speech and language. No gross focal neurologic deficits are appreciated. Skin:  Skin is warm, dry and intact. No rash noted. Psychiatric: Mood and affect are normal. Speech and behavior are normal.    ____________________________________________   DIFFERENTIAL includes but not limited to  Aortic dissection, diverticulitis, appendicitis, pyelonephritis, musculoskeletal pain ____________________________________________   LABS (all labs ordered are listed, but only abnormal results are displayed)  Labs Reviewed  COMPREHENSIVE METABOLIC PANEL - Abnormal; Notable for the following components:      Result Value   Glucose, Bld 110 (*)    AST 185 (*)    ALT 448 (*)    All other components within normal limits  CBC  LIPASE, BLOOD  TROPONIN I  URINE DRUG SCREEN, QUALITATIVE ([facility] ONLY)    Blood work reviewed by me shows slight transaminitis of unclear etiology __________________________________________  EKG  ED ECG REPORT I, Merrily BrittleNeil Tenlee Wollin, the attending physician, personally viewed and interpreted this ECG.  Date: 09/01/2017 EKG Time:  Rate: 79 Rhythm: normal sinus rhythm QRS Axis: normal Intervals: short PR ST/T Wave abnormalities: normal Narrative Interpretation: no  evidence of acute ischemia  ____________________________________________  RADIOLOGY  CT angiogram negative for acute pathology ____________________________________________   PROCEDURES  Procedure(s) performed: no  Procedures  Critical Care performed: no  Observation: no ____________________________________________   INITIAL IMPRESSION / ASSESSMENT AND PLAN / ED COURSE  Pertinent labs & imaging results that were available during my care of the patient were reviewed by me and considered in my medical decision making (see chart for details).  The patient's symptoms are odd with left flank pain right upper chest pain left lower quadrant pain.  He is quite tender diffusely in his back as well as his left lower quadrant.  Unclear etiology of his symptoms and on chart review he has never had his aorta imaged which given his history of stroke I think is reasonable.     Fortunately the patient's imaging is negative for acute pathology.  Unclear etiology of his symptoms and his transaminitis but he should be safe for outpatient management with  symptomatic treatment.  Strict return precautions were given and the patient verbalized understanding and agree with plan. ____________________________________________   FINAL CLINICAL IMPRESSION(S) / ED DIAGNOSES  Final diagnoses:  Left lower quadrant pain  Acute left-sided low back pain without sciatica      NEW MEDICATIONS STARTED DURING THIS VISIT:  This SmartLink is deprecated. Use AVSMEDLIST instead to display the medication list for a patient.   Note:  This document was prepared using Dragon voice recognition software and may include unintentional dictation errors.     Merrily Brittleifenbark, Kaile Bixler, MD 09/02/17 2325

## 2017-09-02 ENCOUNTER — Emergency Department: Payer: Self-pay

## 2017-09-02 MED ORDER — METHOCARBAMOL 500 MG PO TABS: 500.0000 mg | ORAL_TABLET | Freq: Four times a day (QID) | ORAL | 0 refills | Status: DC

## 2017-09-02 MED ORDER — IOPAMIDOL (ISOVUE-370) INJECTION 76%
125.0000 mL | Freq: Once | INTRAVENOUS | Status: AC | PRN
  Administered 2017-09-02: 125 mL via INTRAVENOUS

## 2017-09-02 NOTE — Discharge Instructions (Signed)
Please take your muscle relaxants as needed for severe symptoms and follow-up with primary care this coming Monday for recheck.  Return to the emergency department sooner for any concerns.  It was a pleasure to take care of you today, and thank you for coming to our emergency department.  If you have any questions or concerns before leaving please ask the nurse to grab me and I'm more than happy to go through your aftercare instructions again.  If you were prescribed any opioid pain medication today such as Norco, Vicodin, Percocet, morphine, hydrocodone, or oxycodone please make sure you do not drive when you are taking this medication as it can alter your ability to drive safely.  If you have any concerns once you are home that you are not improving or are in fact getting worse before you can make it to your follow-up appointment, please do not hesitate to call 911 and come back for further evaluation.  Merrily BrittleNeil Majd Tissue, MD  Results for orders placed or performed during the hospital encounter of 09/01/17  CBC  Result Value Ref Range   WBC 9.1 3.8 - 10.6 K/uL   RBC 5.90 4.40 - 5.90 MIL/uL   Hemoglobin 16.6 13.0 - 18.0 g/dL   HCT 91.448.9 78.240.0 - 95.652.0 %   MCV 82.8 80.0 - 100.0 fL   MCH 28.1 26.0 - 34.0 pg   MCHC 34.0 32.0 - 36.0 g/dL   RDW 21.313.7 08.611.5 - 57.814.5 %   Platelets 243 150 - 440 K/uL  Comprehensive metabolic panel  Result Value Ref Range   Sodium 140 135 - 145 mmol/L   Potassium 4.0 3.5 - 5.1 mmol/L   Chloride 106 101 - 111 mmol/L   CO2 25 22 - 32 mmol/L   Glucose, Bld 110 (H) 65 - 99 mg/dL   BUN 15 6 - 20 mg/dL   Creatinine, Ser 4.690.93 0.61 - 1.24 mg/dL   Calcium 9.5 8.9 - 62.910.3 mg/dL   Total Protein 7.6 6.5 - 8.1 g/dL   Albumin 4.5 3.5 - 5.0 g/dL   AST 528185 (H) 15 - 41 U/L   ALT 448 (H) 17 - 63 U/L   Alkaline Phosphatase 100 38 - 126 U/L   Total Bilirubin 0.4 0.3 - 1.2 mg/dL   GFR calc non Af Amer >60 >60 mL/min   GFR calc Af Amer >60 >60 mL/min   Anion gap 9 5 - 15  Lipase,  blood  Result Value Ref Range   Lipase 22 11 - 51 U/L  Troponin I  Result Value Ref Range   Troponin I <0.03 <0.03 ng/mL  Urine Drug Screen, Qualitative ([facility] only)  Result Value Ref Range   Tricyclic, Ur Screen NONE DETECTED NONE DETECTED   Amphetamines, Ur Screen NONE DETECTED NONE DETECTED   MDMA (Ecstasy)Ur Screen NONE DETECTED NONE DETECTED   Cocaine Metabolite,Ur [place] NONE DETECTED NONE DETECTED   Opiate, Ur Screen NONE DETECTED NONE DETECTED   Phencyclidine (PCP) Ur S NONE DETECTED NONE DETECTED   Cannabinoid 50 Ng, Ur [place] NONE DETECTED NONE DETECTED   Barbiturates, Ur Screen NONE DETECTED NONE DETECTED   Benzodiazepine, Ur Scrn NONE DETECTED NONE DETECTED   Methadone Scn, Ur NONE DETECTED NONE DETECTED   Ct Angio Head W Or Wo Contrast  Result Date: 08/19/2017 CLINICAL DATA:  Left-sided facial, arm, and leg numbness. EXAM: CT ANGIOGRAPHY HEAD AND NECK TECHNIQUE: Multidetector CT imaging of the head and neck was performed using the standard protocol during bolus administration of intravenous  contrast. Multiplanar CT image reconstructions and MIPs were obtained to evaluate the vascular anatomy. Carotid stenosis measurements (when applicable) are obtained utilizing NASCET criteria, using the distal internal carotid diameter as the denominator. CONTRAST:  75mL ISOVUE-370 IOPAMIDOL (ISOVUE-370) INJECTION 76% COMPARISON:  Noncontrast head CT earlier today FINDINGS: CTA NECK FINDINGS Aortic arch: Normal diameter and appearance.  Four vessel branching. Right carotid system: Vessels are smooth and widely patent. No atheromatous changes. Left carotid system: Vessels are smooth and widely patent. No atheromatous changes. Vertebral arteries: No proximal right subclavian or brachiocephalic stenosis. Codominant vertebral arteries that are smooth and widely patent to the dura. Skeleton: Normal appearance Other neck: No incidental mass or inflammation. Upper chest: Negative Review of the MIP images  confirms the above findings CTA HEAD FINDINGS Anterior circulation: Symmetric carotid arteries. Dominant left A1 segment with anterior communicating artery present. No branch occlusion, stenosis, or beading. Negative for aneurysm. Posterior circulation: Vertebral and basilar arteries are smooth and widely patent. Symmetric good patency of bilateral posterior cerebral arteries. Negative for aneurysm. Venous sinuses: Patent. 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 Anatomic variants: None significant Delayed phase: Not performed in the emergent setting. Review of the MIP images confirms the above findings IMPRESSION: Normal CTA of the head and neck. Electronically Signed   By: Marnee Spring M.D.   On: 08/19/2017 23:00   Ct Angio Neck W And/or Wo Contrast  Result Date: 08/19/2017 CLINICAL DATA:  Left-sided facial, arm, and leg numbness. EXAM: CT ANGIOGRAPHY HEAD AND NECK TECHNIQUE: Multidetector CT imaging of the head and neck was performed using the standard protocol during bolus administration of intravenous contrast. Multiplanar CT image reconstructions and MIPs were obtained to evaluate the vascular anatomy. Carotid stenosis measurements (when applicable) are obtained utilizing NASCET criteria, using the distal internal carotid diameter as the denominator. CONTRAST:  75mL ISOVUE-370 IOPAMIDOL (ISOVUE-370) INJECTION 76% COMPARISON:  Noncontrast head CT earlier today FINDINGS: CTA NECK FINDINGS Aortic arch: Normal diameter and appearance.  Four vessel branching. Right carotid system: Vessels are smooth and widely patent. No atheromatous changes. Left carotid system: Vessels are smooth and widely patent. No atheromatous changes. Vertebral arteries: No proximal right subclavian or brachiocephalic stenosis. Codominant vertebral arteries that are smooth and widely patent to the dura. Skeleton: Normal appearance Other neck: No incidental mass or inflammation. Upper chest: Negative Review of  the MIP images confirms the above findings CTA HEAD FINDINGS Anterior circulation: Symmetric carotid arteries. Dominant left A1 segment with anterior communicating artery present. No branch occlusion, stenosis, or beading. Negative for aneurysm. Posterior circulation: Vertebral and basilar arteries are smooth and widely patent. Symmetric good patency of bilateral posterior cerebral arteries. Negative for aneurysm. Venous sinuses: Patent. 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 Anatomic variants: None significant Delayed phase: Not performed in the emergent setting. Review of the MIP images confirms the above findings IMPRESSION: Normal CTA of the head and neck. Electronically Signed   By: Marnee Spring M.D.   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 Receive tPA 2-1/2 weeks ago for stroke, negative subsequent workup. Assess stroke. EXAM: MRI HEAD WITHOUT CONTRAST TECHNIQUE: Multiplanar, multiecho pulse sequences of the brain and surrounding structures were obtained without intravenous contrast. COMPARISON:  CT HEAD August 19, 2017 at 2121 hours. MRI of the head report dated August 02, 2017, images are not available for direct comparison. FINDINGS: BRAIN: No  reduced diffusion to suggest acute ischemia. No susceptibility artifact to suggest hemorrhage. The ventricles and sulci are normal for patient's age. LEFT cerebellar developmental venous anomaly better demonstrated on prior imaging. No suspicious parenchymal signal, mass or mass effect. No abnormal extra-axial fluid collections. VASCULAR: Normal major intracranial vascular flow voids present at skull base. SKULL AND UPPER CERVICAL SPINE: No abnormal sellar expansion. No suspicious calvarial bone marrow signal. Craniocervical junction maintained. SINUSES/ORBITS: The mastoid air-cells and included  paranasal sinuses are well-aerated. The included ocular globes and orbital contents are non-suspicious. OTHER: None. IMPRESSION: 1. No acute intracranial process. 2. LEFT cerebellar DVA, otherwise unremarkable noncontrast MRI head. Electronically Signed   By: Awilda Metroourtnay  Bloomer M.D.   On: 08/20/2017 00:00   Ct Angio Chest/abd/pel For Dissection W And/or W/wo  Result Date: 09/02/2017 CLINICAL DATA:  Left-sided abdominal pain radiating to the back. Syncopal episode at home. EXAM: CT ANGIOGRAPHY CHEST, ABDOMEN AND PELVIS TECHNIQUE: Multidetector CT imaging through the chest, abdomen and pelvis was performed using the standard protocol during bolus administration of intravenous contrast. Multiplanar reconstructed images and MIPs were obtained and reviewed to evaluate the vascular anatomy. CONTRAST:  125mL ISOVUE-370 IOPAMIDOL (ISOVUE-370) INJECTION 76% COMPARISON:  06/14/2017, 06/15/2014 FINDINGS: CTA CHEST FINDINGS Cardiovascular: Preferential opacification of the thoracic aorta. No evidence of thoracic aortic aneurysm or dissection. Normal heart size. No pericardial effusion. Pulmonary arteries are also well opacified with no evidence of emboli Mediastinum/Nodes: No enlarged mediastinal, hilar, or axillary lymph nodes. Thyroid gland, trachea, and esophagus demonstrate no significant findings. Lungs/Pleura: Lungs are clear. No pleural effusion or pneumothorax. Musculoskeletal: No significant skeletal abnormality. Review of the MIP images confirms the above findings. CTA ABDOMEN AND PELVIS FINDINGS VASCULAR Aorta: Normal caliber aorta without aneurysm, dissection, vasculitis or significant stenosis. Celiac: Patent without evidence of aneurysm, dissection, vasculitis or significant stenosis. SMA: Patent without evidence of aneurysm, dissection, vasculitis or significant stenosis. Renals: Both main renal arteries and bilateral accessory renal arteries are patent without evidence of aneurysm, dissection, vasculitis,  fibromuscular dysplasia or significant stenosis. IMA: Patent without evidence of aneurysm, dissection, vasculitis or significant stenosis. Inflow: Patent without evidence of aneurysm, dissection, vasculitis or significant stenosis. Veins: No obvious venous abnormality within the limitations of this arterial phase study. Review of the MIP images confirms the above findings. NON-VASCULAR Hepatobiliary: No focal liver abnormality is seen. No gallstones, gallbladder wall thickening, or biliary dilatation. Pancreas: Unremarkable. No pancreatic ductal dilatation or surrounding inflammatory changes. Spleen: Normal in size without focal abnormality. Adrenals/Urinary Tract: Adrenal glands are unremarkable. Kidneys are normal, without renal calculi, focal lesion, or hydronephrosis. Bladder is unremarkable. Stomach/Bowel: Stomach is within normal limits. Appendix appears normal. No evidence of bowel wall thickening, distention, or inflammatory changes. Lymphatic: No pathologic adenopathy in the abdomen or pelvis. Reproductive: Unremarkable Other: No focal inflammation. No ascites. Tiny fat containing umbilical hernia. Musculoskeletal: No significant skeletal abnormality. Review of the MIP images confirms the above findings. IMPRESSION: No significant abnormality. Electronically Signed   By: Ellery Plunkaniel R Mitchell M.D.   On: 09/02/2017 01:01   Ct Head Code Stroke Wo Contrast  Result Date: 08/19/2017 CLINICAL DATA:  Code stroke. Ataxia, stroke suspected. Slurred speech and left-sided weakness. EXAM: CT HEAD WITHOUT CONTRAST TECHNIQUE: Contiguous axial images were obtained from the base of the skull through the vertex without intravenous contrast. COMPARISON:  None. FINDINGS: Brain: No evidence of infarction, hemorrhage, hydrocephalus, extra-axial collection or mass lesion/mass effect. Vascular: No asymmetric vessel density. Skull: Normal. Negative for fracture or focal lesion. Sinuses/Orbits: Negative Other: These results were  called by telephone at the time of interpretation on 08/19/2017 at 9:35 pm to Dr. Dorothea Glassman , who verbally acknowledged these results. ASPECTS [facility] Stroke Program Early CT Score) -left hemisphere - Ganglionic level infarction (caudate, lentiform nuclei, internal capsule, insula, M1-M3 cortex): 7 - Supraganglionic infarction (M4-M6 cortex): 3 Total score (0-10 with 10 being normal): 10 IMPRESSION: Negative head CT. Electronically Signed   By: Marnee Spring M.D.   On: 08/19/2017 21:36

## 2017-09-02 NOTE — ED Notes (Signed)
Pt discharged to home.  Family member driving.  Discharge instructions reviewed.  Verbalized understanding.  No questions or concerns at this time.  Teach back verified.  Pt in NAD.  No items left in ED.   

## 2017-10-27 ENCOUNTER — Emergency Department
Admission: EM | Admit: 2017-10-27 | Discharge: 2017-10-27 | Disposition: A | Discharge status: Home / Self Care | Payer: Managed Care, Other (non HMO) | Attending: Emergency Medicine | Admitting: Emergency Medicine

## 2017-10-27 ENCOUNTER — Encounter: Payer: Self-pay | Admitting: Intensive Care

## 2017-10-27 ENCOUNTER — Emergency Department: Payer: Managed Care, Other (non HMO)

## 2017-10-27 ENCOUNTER — Other Ambulatory Visit: Payer: Self-pay

## 2017-10-27 DIAGNOSIS — Z7982 Long term (current) use of aspirin: Secondary | ICD-10-CM | POA: Diagnosis not present

## 2017-10-27 DIAGNOSIS — R0789 Other chest pain: Secondary | ICD-10-CM | POA: Insufficient documentation

## 2017-10-27 DIAGNOSIS — Z87891 Personal history of nicotine dependence: Secondary | ICD-10-CM | POA: Diagnosis not present

## 2017-10-27 DIAGNOSIS — R079 Chest pain, unspecified: Secondary | ICD-10-CM | POA: Diagnosis present

## 2017-10-27 DIAGNOSIS — Z79899 Other long term (current) drug therapy: Secondary | ICD-10-CM | POA: Insufficient documentation

## 2017-10-27 HISTORY — DX: Hyperlipidemia, unspecified: E78.5

## 2017-10-27 LAB — BASIC METABOLIC PANEL
ANION GAP: 10 (ref 5–15)
BUN: 14 mg/dL (ref 6–20)
CALCIUM: 9.4 mg/dL (ref 8.9–10.3)
CO2: 23 mmol/L (ref 22–32)
Chloride: 105 mmol/L (ref 101–111)
Creatinine, Ser: 0.76 mg/dL (ref 0.61–1.24)
GFR calc Af Amer: >60 mL/min (ref >60)
GLUCOSE: 95 mg/dL (ref 65–99)
Potassium: 4.2 mmol/L (ref 3.5–5.1)
Sodium: 138 mmol/L (ref 135–145)

## 2017-10-27 LAB — CBC
HEMATOCRIT: 49.7 % (ref 40.0–52.0)
HEMOGLOBIN: 17 g/dL (ref 13.0–18.0)
MCH: 28.5 pg (ref 26.0–34.0)
MCHC: 34.3 g/dL (ref 32.0–36.0)
MCV: 83.1 fL (ref 80.0–100.0)
Platelets: 255 10*3/uL (ref 150–440)
RBC: 5.98 MIL/uL — AB (ref 4.40–5.90)
RDW: 13.6 % (ref 11.5–14.5)
WBC: 10 10*3/uL (ref 3.8–10.6)

## 2017-10-27 LAB — TROPONIN I: Troponin I: <0.03 ng/mL (ref <0.03)

## 2017-10-27 NOTE — ED Provider Notes (Signed)
[facility] Emergency Department Provider Note  ____________________________________________   First MD Initiated Contact with Patient 10/27/17 2039     (approximate)  I have reviewed the triage vital signs and the nursing notes.   HISTORY  Chief Complaint Chest Pain   HPI Luke ChurchKenneth D Berhe Jr. is a 27 y.o. male who self presents to the emergency department with roughly 24 hours of intermittent left neck pain radiating to his left lateral chest.  The pain is moderate severity throbbing aching seems to be somewhat worse with movement somewhat improved with rest.  No shortness of breath.  No nausea or vomiting.  No numbness or weakness.  No recent illness.  Pain is improved with Aleve.  Past Medical History:  Diagnosis Date  . CVA (cerebral vascular accident) (HCC)   . GERD (gastroesophageal reflux disease)   . Hyperlipidemia     Patient Active Problem List   Diagnosis Date Noted  . Left-sided weakness 08/19/2017  . GERD (gastroesophageal reflux disease) 08/19/2017  . History of stroke 08/19/2017    Past Surgical History:  Procedure Laterality Date  . NO PAST SURGERIES      Prior to Admission medications   Medication Sig Start Date End Date Taking? Authorizing Provider  aspirin EC 81 MG tablet Take 81 mg by mouth daily.    [provider]  atorvastatin (LIPITOR) 80 MG tablet Take 80 mg by mouth daily.    [provider]  brompheniramine-pseudoephedrine-DM 30-2-10 MG/5ML syrup Take 10 mLs by mouth 4 (four) times daily as needed. Patient not taking: Reported on 08/19/2017 04/17/17   Tommi RumpsSummers, Rhonda L, PA-C  esomeprazole (NEXIUM) 20 MG capsule Take 20 mg by mouth daily at 12 noon.    [provider]  HYDROcodone-acetaminophen (NORCO) 5-325 MG tablet Take 1 tablet by mouth every 6 (six) hours as needed for severe pain. Patient not taking: Reported on 08/19/2017 06/14/17   Merrily Brittleifenbark, Freedom Peddy, MD  ibuprofen (ADVIL,MOTRIN) 600 MG  tablet Take 1 tablet (600 mg total) by mouth every 8 (eight) hours as needed. Patient not taking: Reported on 08/19/2017 06/14/17   Merrily Brittleifenbark, Yetzali Weld, MD  methocarbamol (ROBAXIN) 500 MG tablet Take 1 tablet (500 mg total) by mouth 4 (four) times daily. 09/02/17   Merrily Brittleifenbark, Akeisha Lagerquist, MD    Allergies Patient has no known allergies.  Family History  Problem Relation Age of Onset  . Diabetes Mother   . Heart attack Mother   . Crohn's disease Father   . Hypertension Father     Social History Social History   Tobacco Use  . Smoking status: Former Smoker    Packs/day: 0.50    Types: Cigarettes    Last attempt to quit: 07/27/2017    Years since quitting: 0.2  . Smokeless tobacco: Former Engineer, waterUser  Substance Use Topics  . Alcohol use: Yes    Alcohol/week: 1.2 - 1.8 oz    Types: 2 - 3 Cans of beer per week    Comment: twice per week  . Drug use: No    Review of Systems Constitutional: No fever/chills Eyes: No visual changes. ENT: No sore throat. Cardiovascular: Positive for chest pain. Respiratory: Denies shortness of breath. Gastrointestinal: No abdominal pain.  No nausea, no vomiting.  No diarrhea.  No constipation. Genitourinary: Negative for dysuria. Musculoskeletal: Negative for back pain. Skin: Negative for rash. Neurological: Negative for headaches, focal weakness or numbness.   ____________________________________________   PHYSICAL EXAM:  VITAL SIGNS: ED Triage Vitals [10/27/17 1845]  Enc Vitals  Group     BP 115/72     Pulse Rate 77     Resp 16     Temp 98.3 F (36.8 C)     Temp Source Oral     SpO2 97 %     Weight 170 lb (77.1 kg)     Height 5\' 7"  (1.702 m)     Head Circumference      Peak Flow      Pain Score 9     Pain Loc      Pain Edu?      Excl. in GC?     Constitutional: Alert and oriented x4 well-appearing nontoxic no diaphoresis speaks in full clear sentences Eyes: PERRL EOMI. Head: Atraumatic. Nose: No congestion/rhinnorhea. Mouth/Throat: No  trismus Neck: No stridor.   Cardiovascular: Normal rate, regular rhythm. Grossly normal heart sounds.  Good peripheral circulation. Respiratory: Normal respiratory effort.  No retractions. Lungs CTAB and moving good air Gastrointestinal: Soft nontender Musculoskeletal: No lower extremity edema   Neurologic:  Normal speech and language. No gross focal neurologic deficits are appreciated. Skin:  Skin is warm, dry and intact. No rash noted. Psychiatric: Mood and affect are normal. Speech and behavior are normal.    ____________________________________________   DIFFERENTIAL includes but not limited to  Pneumonia, pneumothorax, dissection, musculoskeletal pain, influenza ____________________________________________   LABS (all labs ordered are listed, but only abnormal results are displayed)  Labs Reviewed  CBC - Abnormal; Notable for the following components:      Result Value   RBC 5.98 (*)    All other components within normal limits  BASIC METABOLIC PANEL  TROPONIN I    Lab work reviewed by me with no acute disease __________________________________________  EKG  ED ECG REPORT I, [name], the attending physician, personally viewed and interpreted this ECG.  Date: 10/27/2017 EKG Time:  Rate: 73 Rhythm: normal sinus rhythm QRS Axis: normal Intervals: normal ST/T Wave abnormalities: normal Narrative Interpretation: no evidence of acute ischemia  ____________________________________________  RADIOLOGY  Chest x-Luke reviewed by me with no acute disease ____________________________________________   PROCEDURES  Procedure(s) performed: no  Procedures  Critical Care performed: no  Observation: no ____________________________________________   INITIAL IMPRESSION / ASSESSMENT AND PLAN / ED COURSE  Pertinent labs & imaging results that were available during my care of the patient were reviewed by me and considered in my medical decision making (see  chart for details).  The patient is very well-appearing with a normal EKG and normal blood work and unremarkable chest x-Luke.  He is currently asymptomatic.  Had a lengthy discussion with patient regarding the diagnostic uncertainty and I have encouraged him to follow-up with primary care.  Strict return precautions have been given and the patient verbalized understanding and agreement with the plan.      ____________________________________________   FINAL CLINICAL IMPRESSION(S) / ED DIAGNOSES  Final diagnoses:  Atypical chest pain      NEW MEDICATIONS STARTED DURING THIS VISIT:  New Prescriptions   No medications on file     Note:  This document was prepared using Dragon voice recognition software and may include unintentional dictation errors.     [name], MD 10/27/17 2141

## 2017-10-27 NOTE — ED Triage Notes (Signed)
FIRST NURSE NOTE-here for chest pain. ambulatory without distress. Pulled for EKG when triage room opens, all currently full

## 2017-10-27 NOTE — Discharge Instructions (Signed)
Fortunately today your chest x-ray, your EKG, and your blood work were reassuring.  Please continue taking Aleve over-the-counter as needed for pain and follow-up with your primary care physician as needed.  Return to the emergency department sooner for any concerns.  It was a pleasure to take care of you today, and thank you for coming to our emergency department.  If you have any questions or concerns before leaving please ask the nurse to grab me and I'm more than happy to go through your aftercare instructions again.  If you were prescribed any opioid pain medication today such as Norco, Vicodin, Percocet, morphine, hydrocodone, or oxycodone please make sure you do not drive when you are taking this medication as it can alter your ability to drive safely.  If you have any concerns once you are home that you are not improving or are in fact getting worse before you can make it to your follow-up appointment, please do not hesitate to call 911 and come back for further evaluation.  Merrily BrittleNeil Rudi Bunyard, MD  Results for orders placed or performed during the hospital encounter of 10/27/17  Basic metabolic panel  Result Value Ref Range   Sodium 138 135 - 145 mmol/L   Potassium 4.2 3.5 - 5.1 mmol/L   Chloride 105 101 - 111 mmol/L   CO2 23 22 - 32 mmol/L   Glucose, Bld 95 65 - 99 mg/dL   BUN 14 6 - 20 mg/dL   Creatinine, Ser 1.610.76 0.61 - 1.24 mg/dL   Calcium 9.4 8.9 - 09.610.3 mg/dL   GFR calc non Af Amer >60 >60 mL/min   GFR calc Af Amer >60 >60 mL/min   Anion gap 10 5 - 15  CBC  Result Value Ref Range   WBC 10.0 3.8 - 10.6 K/uL   RBC 5.98 (H) 4.40 - 5.90 MIL/uL   Hemoglobin 17.0 13.0 - 18.0 g/dL   HCT 04.549.7 40.940.0 - 81.152.0 %   MCV 83.1 80.0 - 100.0 fL   MCH 28.5 26.0 - 34.0 pg   MCHC 34.3 32.0 - 36.0 g/dL   RDW 91.413.6 78.211.5 - 95.614.5 %   Platelets 255 150 - 440 K/uL  Troponin I  Result Value Ref Range   Troponin I <0.03 <0.03 ng/mL   Dg Chest 2 View  Result Date: 10/27/2017 CLINICAL DATA:  Chest pain  beginning last night, radiating to the neck. EXAM: CHEST  2 VIEW COMPARISON:  06/14/2017 FINDINGS: Heart size is normal. Mediastinal shadows are normal. The lungs are clear. No bronchial thickening. No infiltrate, mass, effusion or collapse. Pulmonary vascularity is normal. No bony abnormality. IMPRESSION: Normal chest Electronically Signed   By: Paulina FusiMark  Shogry M.D.   On: 10/27/2017 19:08

## 2017-10-27 NOTE — ED Triage Notes (Signed)
Patient c/o dull/sharp chest pain that started last night with radiation to neck. Ambulatory to triage with no distress noted. Also c/o SOB and dizziness with chest pain. Able to talk in complete sentences

## 2017-10-27 NOTE — ED Notes (Addendum)
Patient c/o intermittent left chest pain rated at 6 of 10 radiating to neck. Patient reports pain alternates between sharp and dull. Patient denies SOB, however, reports pain is worse with deep exhalation. Patient denies nausea, vomiting, lightheadedness and weakness. Patient reports earlier episode of dizziness that has since resolved.   Patient reports hx of smoking and high cholesterol. Patient reports that he is borderline diabetic. Patient reports family hx of heart disease.

## 2017-10-27 NOTE — ED Notes (Signed)
Patient transported to X-ray 

## 2017-10-27 NOTE — ED Notes (Addendum)
Reviewed discharge instructions, follow-up care with patient. Patient verbalized understanding of all information reviewed. Patient stable, with no distress noted at this time.    

## 2018-07-28 ENCOUNTER — Other Ambulatory Visit: Payer: Self-pay

## 2018-07-28 ENCOUNTER — Emergency Department
Admission: EM | Admit: 2018-07-28 | Discharge: 2018-07-28 | Disposition: A | Discharge status: Home / Self Care | Payer: Managed Care, Other (non HMO) | Attending: Emergency Medicine | Admitting: Emergency Medicine

## 2018-07-28 DIAGNOSIS — M545 Low back pain, unspecified: Secondary | ICD-10-CM

## 2018-07-28 DIAGNOSIS — Z8673 Personal history of transient ischemic attack (TIA), and cerebral infarction without residual deficits: Secondary | ICD-10-CM | POA: Insufficient documentation

## 2018-07-28 DIAGNOSIS — Z79899 Other long term (current) drug therapy: Secondary | ICD-10-CM | POA: Diagnosis not present

## 2018-07-28 DIAGNOSIS — Z7982 Long term (current) use of aspirin: Secondary | ICD-10-CM | POA: Diagnosis not present

## 2018-07-28 DIAGNOSIS — F17200 Nicotine dependence, unspecified, uncomplicated: Secondary | ICD-10-CM | POA: Insufficient documentation

## 2018-07-28 DIAGNOSIS — F419 Anxiety disorder, unspecified: Secondary | ICD-10-CM | POA: Diagnosis not present

## 2018-07-28 DIAGNOSIS — M549 Dorsalgia, unspecified: Secondary | ICD-10-CM | POA: Diagnosis present

## 2018-07-28 HISTORY — DX: Anxiety disorder, unspecified: F41.9

## 2018-07-28 LAB — URINALYSIS, COMPLETE (UACMP) WITH MICROSCOPIC
BACTERIA UA: NONE SEEN
Bilirubin Urine: NEGATIVE
GLUCOSE, UA: NEGATIVE mg/dL
HGB URINE DIPSTICK: NEGATIVE
KETONES UR: NEGATIVE mg/dL
LEUKOCYTES UA: NEGATIVE
Nitrite: NEGATIVE
PROTEIN: NEGATIVE mg/dL
Specific Gravity, Urine: 1.002 — ABNORMAL LOW (ref 1.005–1.030)
WBC, UA: NONE SEEN WBC/hpf (ref 0–5)
pH: 5 (ref 5.0–8.0)

## 2018-07-28 LAB — CBC
HCT: 46.6 % (ref 39.0–52.0)
HEMOGLOBIN: 16 g/dL (ref 13.0–17.0)
MCH: 27.9 pg (ref 26.0–34.0)
MCHC: 34.3 g/dL (ref 30.0–36.0)
MCV: 81.3 fL (ref 80.0–100.0)
PLATELETS: 241 10*3/uL (ref 150–400)
RBC: 5.73 MIL/uL (ref 4.22–5.81)
RDW: 13.2 % (ref 11.5–15.5)
WBC: 7.2 10*3/uL (ref 4.0–10.5)
nRBC: 0 % (ref 0.0–0.2)

## 2018-07-28 LAB — BASIC METABOLIC PANEL
Anion gap: 9 (ref 5–15)
BUN: 13 mg/dL (ref 6–20)
CALCIUM: 9.4 mg/dL (ref 8.9–10.3)
CO2: 25 mmol/L (ref 22–32)
CREATININE: 0.9 mg/dL (ref 0.61–1.24)
Chloride: 107 mmol/L (ref 98–111)
GFR calc Af Amer: >60 mL/min (ref >60)
GLUCOSE: 105 mg/dL — AB (ref 70–99)
Potassium: 4.3 mmol/L (ref 3.5–5.1)
SODIUM: 141 mmol/L (ref 135–145)

## 2018-07-28 MED ORDER — CYCLOBENZAPRINE HCL 5 MG PO TABS: 5.0000 mg | ORAL_TABLET | Freq: Three times a day (TID) | ORAL | 0 refills | Status: DC | PRN

## 2018-07-28 NOTE — Discharge Instructions (Signed)
return to the emergency room for new or worrisome symptoms including increased pain, fever, vomiting, numbness, weakness, incontinence of bowel or bladder, or if you feel worse in any way. Take the medications as prescribed. Do not drive or take any medications. Follow close it with your  primary care doctor.

## 2018-07-28 NOTE — ED Notes (Signed)
FIRST NURSE NOTE:  Pt reports of low back pain for several days, states he thinks its a kidney infection.

## 2018-07-28 NOTE — ED Triage Notes (Signed)
PT c/o BL flank pain with N/V/fever for the past 3-4 days with painful urination. States he has a hx of kidney infections in the past.

## 2018-07-28 NOTE — ED Provider Notes (Signed)
[facility] Emergency Department Provider Note  ____________________________________________   I have reviewed the triage vital signs and the nursing notes. Where available I have reviewed prior notes and, if possible and indicated, outside hospital notes.    HISTORY  Chief Complaint Flank Pain    HPI [name] is a 27 y.o. male  with a history of anxiety, extensive negative imaging the last several years including multiple MRIsCT scan of the head multiple times, multiple CT scans of the chest, CT abdomen and pelvis for back pain etc. Patient states that his back pain is "flaring". No numbness no weakness. No incontinence of bowel or bladder. No trauma no fall. The lower paraspinal region bilaterally. No radicular symptoms. No other complaints.    Past Medical History:  Diagnosis Date  . Anxiety   . CVA (cerebral vascular accident) (HCC)   . GERD (gastroesophageal reflux disease)   . Hyperlipidemia     Patient Active Problem List   Diagnosis Date Noted  . Left-sided weakness 08/19/2017  . GERD (gastroesophageal reflux disease) 08/19/2017  . History of stroke 08/19/2017    Past Surgical History:  Procedure Laterality Date  . NO PAST SURGERIES      Prior to Admission medications   Medication Sig Start Date End Date Taking? Authorizing Provider  aspirin EC 81 MG tablet Take 81 mg by mouth daily.    [provider]  atorvastatin (LIPITOR) 80 MG tablet Take 80 mg by mouth daily.    [provider]  brompheniramine-pseudoephedrine-DM 30-2-10 MG/5ML syrup Take 10 mLs by mouth 4 (four) times daily as needed. Patient not taking: Reported on 08/19/2017 04/17/17   [name], PA-C  esomeprazole (NEXIUM) 20 MG capsule Take 20 mg by mouth daily at 12 noon.    [provider]  HYDROcodone-acetaminophen (NORCO) 5-325 MG tablet Take 1 tablet by mouth every 6 (six) hours as needed for severe pain. Patient not taking:  Reported on 08/19/2017 06/14/17   [name], MD  ibuprofen (ADVIL,MOTRIN) 600 MG tablet Take 1 tablet (600 mg total) by mouth every 8 (eight) hours as needed. Patient not taking: Reported on 08/19/2017 06/14/17   [name], MD  methocarbamol (ROBAXIN) 500 MG tablet Take 1 tablet (500 mg total) by mouth 4 (four) times daily. 09/02/17   [name], MD    Allergies Patient has no known allergies.  Family History  Problem Relation Age of Onset  . Diabetes Mother   . Heart attack Mother   . Crohn's disease Father   . Hypertension Father     Social History Social History   Tobacco Use  . Smoking status: Current Every Day Smoker    Packs/day: 0.50    Last attempt to quit: 07/27/2017    Years since quitting: 1.0  . Smokeless tobacco: Former Engineer, water Use Topics  . Alcohol use: Yes    Alcohol/week: 2.0 - 3.0 standard drinks    Types: 2 - 3 Cans of beer per week    Comment: twice per week  . Drug use: No    Review of Systems Constitutional: No fever/chills Eyes: No visual changes. ENT: No sore throat. No stiff neck no neck pain Cardiovascular: Denies chest pain. Respiratory: Denies shortness of breath. Gastrointestinal:   no vomiting.  No diarrhea.  No constipation. Genitourinary: Negative for dysuria. Musculoskeletal: Negative lower extremity swelling Skin: Negative for rash. Neurological: Negative for severe headaches, focal weakness or numbness.   ____________________________________________   PHYSICAL EXAM:  VITAL SIGNS: ED Triage Vitals  Enc Vitals Group     BP 07/28/18 1114 115/74     Pulse Rate 07/28/18 1114 73     Resp 07/28/18 1114 18     Temp 07/28/18 1114 98.2 F (36.8 C)     Temp Source 07/28/18 1114 Oral     SpO2 07/28/18 1114 97 %     Weight 07/28/18 1115 160 lb (72.6 kg)     Height 07/28/18 1115 5\' 7"  (1.702 m)     Head Circumference --      Peak Flow --      Pain Score 07/28/18 1115 8     Pain Loc --      Pain Edu? --       Excl. in GC? --     Constitutional: Alert and oriented. Well appearing and in no acute distress. Eyes: Conjunctivae are normal Head: Atraumatic HEENT: No congestion/rhinnorhea. Mucous membranes are moist.  Oropharynx non-erythematous Neck:   Nontender with no meningismus, no masses, no stridor Cardiovascular: Normal rate, regular rhythm. Grossly normal heart sounds.  Good peripheral circulation. Respiratory: Normal respiratory effort.  No retractions. Lungs CTAB. Abdominal: Soft and nontender. No distention. No guarding no rebound Back:  patient able to sit up in bed with no difficulty, has bilateral paraspinal lumbar discomfort with no shingles or lesions noted  there is no midline tenderness there are no lesions noted. there is no CVA tenderness Musculoskeletal: No lower extremity tenderness, no upper extremity tenderness. No joint effusions, no DVT signs strong distal pulses no edema Neurologic:  Normal speech and language. No gross focal neurologic deficits are appreciated. no saddle anesthesia Skin:  Skin is warm, dry and intact. No rash noted. Psychiatric: Mood and affect are normal. Speech and behavior are normal.  ____________________________________________   LABS (all labs ordered are listed, but only abnormal results are displayed)  Labs Reviewed  URINALYSIS, COMPLETE (UACMP) WITH MICROSCOPIC - Abnormal; Notable for the following components:      Result Value   Color, Urine COLORLESS (*)    APPearance CLEAR (*)    Specific Gravity, Urine 1.002 (*)    All other components within normal limits  BASIC METABOLIC PANEL - Abnormal; Notable for the following components:   Glucose, Bld 105 (*)    All other components within normal limits  CBC    Pertinent labs  results that were available during my care of the patient were reviewed by me and considered in my medical decision making (see chart for details). ____________________________________________  EKG  I personally  interpreted any EKGs ordered by me or triage  ____________________________________________  RADIOLOGY  Pertinent labs & imaging results that were available during my care of the patient were reviewed by me and considered in my medical decision making (see chart for details). If possible, patient and/or family made aware of any abnormal findings.  No results found. ____________________________________________    PROCEDURES  Procedure(s) performed: None  Procedures  Critical Care performed: None  ____________________________________________   INITIAL IMPRESSION / ASSESSMENT AND PLAN / ED COURSE  Pertinent labs & imaging results that were available during my care of the patient were reviewed by me and considered in my medical decision making (see chart for details).  patient with chronic recurrent back pain for the last several years presents today with his chronic back pain. No evidence of cauda equina syndrome or signs and symptoms to suggest the patient has an abscess or hematoma, no evidence of shingles no evidence  of referred abdominal pain abdomen is benign, patient states he's tried Tylenol at home with minimal relief we can try Flexeril, advised him not to drive on the medication. Return precautions and follow given and understood. No red flags noted on exam.    ____________________________________________   FINAL CLINICAL IMPRESSION(S) / ED DIAGNOSES  Final diagnoses:  None      This chart was dictated using voice recognition software.  Despite best efforts to proofread,  errors can occur which can change meaning.      Jeanmarie Plant, MD 07/28/18 1346

## 2018-08-22 ENCOUNTER — Emergency Department: Payer: Managed Care, Other (non HMO)

## 2018-08-22 ENCOUNTER — Other Ambulatory Visit: Payer: Self-pay

## 2018-08-22 ENCOUNTER — Emergency Department
Admission: EM | Admit: 2018-08-22 | Discharge: 2018-08-22 | Disposition: A | Discharge status: Home / Self Care | Payer: Managed Care, Other (non HMO) | Attending: Emergency Medicine | Admitting: Emergency Medicine

## 2018-08-22 ENCOUNTER — Encounter: Payer: Self-pay | Admitting: Emergency Medicine

## 2018-08-22 DIAGNOSIS — Z7982 Long term (current) use of aspirin: Secondary | ICD-10-CM | POA: Diagnosis not present

## 2018-08-22 DIAGNOSIS — Z79899 Other long term (current) drug therapy: Secondary | ICD-10-CM | POA: Insufficient documentation

## 2018-08-22 DIAGNOSIS — F17210 Nicotine dependence, cigarettes, uncomplicated: Secondary | ICD-10-CM | POA: Diagnosis not present

## 2018-08-22 DIAGNOSIS — Z8673 Personal history of transient ischemic attack (TIA), and cerebral infarction without residual deficits: Secondary | ICD-10-CM | POA: Insufficient documentation

## 2018-08-22 DIAGNOSIS — R1031 Right lower quadrant pain: Secondary | ICD-10-CM | POA: Diagnosis present

## 2018-08-22 DIAGNOSIS — K529 Noninfective gastroenteritis and colitis, unspecified: Secondary | ICD-10-CM | POA: Insufficient documentation

## 2018-08-22 DIAGNOSIS — R112 Nausea with vomiting, unspecified: Secondary | ICD-10-CM | POA: Insufficient documentation

## 2018-08-22 LAB — URINALYSIS, COMPLETE (UACMP) WITH MICROSCOPIC
Bacteria, UA: NONE SEEN
Bilirubin Urine: NEGATIVE
Glucose, UA: NEGATIVE mg/dL
HGB URINE DIPSTICK: NEGATIVE
Ketones, ur: NEGATIVE mg/dL
Leukocytes, UA: NEGATIVE
NITRITE: NEGATIVE
Protein, ur: NEGATIVE mg/dL
Specific Gravity, Urine: 1.004 — ABNORMAL LOW (ref 1.005–1.030)
pH: 5 (ref 5.0–8.0)

## 2018-08-22 LAB — COMPREHENSIVE METABOLIC PANEL
ALK PHOS: 76 U/L (ref 38–126)
ALT: 46 U/L — ABNORMAL HIGH (ref 0–44)
AST: 32 U/L (ref 15–41)
Albumin: 4 g/dL (ref 3.5–5.0)
Anion gap: 6 (ref 5–15)
BUN: 9 mg/dL (ref 6–20)
CALCIUM: 8.7 mg/dL — AB (ref 8.9–10.3)
CO2: 25 mmol/L (ref 22–32)
Chloride: 108 mmol/L (ref 98–111)
Creatinine, Ser: 0.68 mg/dL (ref 0.61–1.24)
GFR calc non Af Amer: >60 mL/min (ref >60)
Glucose, Bld: 99 mg/dL (ref 70–99)
Potassium: 4 mmol/L (ref 3.5–5.1)
Sodium: 139 mmol/L (ref 135–145)
TOTAL PROTEIN: 6.6 g/dL (ref 6.5–8.1)
Total Bilirubin: 0.5 mg/dL (ref 0.3–1.2)

## 2018-08-22 LAB — CBC
HCT: 46.4 % (ref 39.0–52.0)
Hemoglobin: 15.7 g/dL (ref 13.0–17.0)
MCH: 28 pg (ref 26.0–34.0)
MCHC: 33.8 g/dL (ref 30.0–36.0)
MCV: 82.7 fL (ref 80.0–100.0)
Platelets: 190 10*3/uL (ref 150–400)
RBC: 5.61 MIL/uL (ref 4.22–5.81)
RDW: 13.4 % (ref 11.5–15.5)
WBC: 5.9 10*3/uL (ref 4.0–10.5)
nRBC: 0 % (ref 0.0–0.2)

## 2018-08-22 LAB — LIPASE, BLOOD: Lipase: 28 U/L (ref 11–51)

## 2018-08-22 MED ORDER — IOPAMIDOL (ISOVUE-300) INJECTION 61%
100.0000 mL | Freq: Once | INTRAVENOUS | Status: AC | PRN
  Administered 2018-08-22: 100 mL via INTRAVENOUS
  Filled 2018-08-22: qty 100

## 2018-08-22 MED ORDER — ONDANSETRON HCL 4 MG/2ML IJ SOLN
4.0000 mg | Freq: Once | INTRAMUSCULAR | Status: AC
  Administered 2018-08-22: 4 mg via INTRAVENOUS
  Filled 2018-08-22: qty 2

## 2018-08-22 MED ORDER — MORPHINE SULFATE (PF) 4 MG/ML IV SOLN
4.0000 mg | Freq: Once | INTRAVENOUS | Status: AC
  Administered 2018-08-22: 4 mg via INTRAVENOUS
  Filled 2018-08-22: qty 1

## 2018-08-22 MED ORDER — IOPAMIDOL (ISOVUE-300) INJECTION 61%
30.0000 mL | Freq: Once | INTRAVENOUS | Status: AC | PRN
  Administered 2018-08-22: 30 mL via ORAL
  Filled 2018-08-22: qty 30

## 2018-08-22 NOTE — ED Notes (Signed)
Pt sitting up in bed, in NAD, warm blanket given, bed locked and low.

## 2018-08-22 NOTE — ED Notes (Signed)
Patient transported to CT 

## 2018-08-22 NOTE — ED Triage Notes (Signed)
Pt here with c/o Right mid abd pain that began on Sat with diarrhea, nausea, and one incident of vomiting, has had fever since Sat, but none today so far, appears in NAD

## 2018-08-22 NOTE — ED Provider Notes (Signed)
[facility] Emergency Department Provider Note   ____________________________________________    I have reviewed the triage vital signs and the nursing notes.   HISTORY  Chief Complaint Abdominal Pain     HPI Luke Conway. is a 27 y.o. male who presents with right lower quadrant abdominal pain.  Patient reports pain started 2 days ago, he has had nausea and one episode of vomiting.  Subjective fever since Saturday.  One episode of loose stools.  Reports the pain is severe and sharp in nature.  Worse with movement.  Has never had this before.  Has not taken anything for this.  Nothing seems to make it better except holding still  Past Medical History:  Diagnosis Date  . Anxiety   . CVA (cerebral vascular accident) (HCC)   . GERD (gastroesophageal reflux disease)   . Hyperlipidemia     Patient Active Problem List   Diagnosis Date Noted  . Left-sided weakness 08/19/2017  . GERD (gastroesophageal reflux disease) 08/19/2017  . History of stroke 08/19/2017    Past Surgical History:  Procedure Laterality Date  . NO PAST SURGERIES      Prior to Admission medications   Medication Sig Start Date End Date Taking? Authorizing Provider  aspirin EC 81 MG tablet Take 81 mg by mouth daily.    [provider]  atorvastatin (LIPITOR) 80 MG tablet Take 80 mg by mouth daily.    [provider]  brompheniramine-pseudoephedrine-DM 30-2-10 MG/5ML syrup Take 10 mLs by mouth 4 (four) times daily as needed. Patient not taking: Reported on 08/19/2017 04/17/17   [name], PA-C  cyclobenzaprine (FLEXERIL) 5 MG tablet Take 1 tablet (5 mg total) by mouth 3 (three) times daily as needed for muscle spasms (do not drive on this med). 07/28/18   Jeanmarie Plant, MD  esomeprazole (NEXIUM) 20 MG capsule Take 20 mg by mouth daily at 12 noon.    [provider]  HYDROcodone-acetaminophen (NORCO) 5-325 MG tablet Take 1 tablet by mouth every  6 (six) hours as needed for severe pain. Patient not taking: Reported on 08/19/2017 06/14/17   [name], MD  ibuprofen (ADVIL,MOTRIN) 600 MG tablet Take 1 tablet (600 mg total) by mouth every 8 (eight) hours as needed. Patient not taking: Reported on 08/19/2017 06/14/17   [name], MD  methocarbamol (ROBAXIN) 500 MG tablet Take 1 tablet (500 mg total) by mouth 4 (four) times daily. 09/02/17   [name], MD     Allergies Patient has no known allergies.  Family History  Problem Relation Age of Onset  . Diabetes Mother   . Heart attack Mother   . Crohn's disease Father   . Hypertension Father     Social History Social History   Tobacco Use  . Smoking status: Current Every Day Smoker    Packs/day: 0.50    Last attempt to quit: 07/27/2017    Years since quitting: 1.0  . Smokeless tobacco: Former Engineer, water Use Topics  . Alcohol use: Yes    Alcohol/week: 2.0 - 3.0 standard drinks    Types: 2 - 3 Cans of beer per week    Comment: twice per week  . Drug use: No    Review of Systems  Constitutional: No fever/chills Eyes: No visual changes.  ENT: No sore throat. Cardiovascular: Denies chest pain. Respiratory: Denies shortness of breath. Gastrointestinal: As above Genitourinary: Negative for dysuria. Musculoskeletal: Negative for back pain. Skin: Negative for rash.  Neurological: Negative for headaches    ____________________________________________   PHYSICAL EXAM:  VITAL SIGNS: ED Triage Vitals  Enc Vitals Group     BP 08/22/18 1238 123/77     Pulse Rate 08/22/18 1238 75     Resp 08/22/18 1238 18     Temp 08/22/18 1254 98.2 F (36.8 C)     Temp Source 08/22/18 1238 Oral     SpO2 08/22/18 1238 100 %     Weight 08/22/18 1239 72.6 kg (160 lb)     Height 08/22/18 1239 1.702 m (5\' 7" )     Head Circumference --      Peak Flow --      Pain Score 08/22/18 1239 8     Pain Loc --      Pain Edu? --      Excl. in GC? --     Constitutional:  Alert and oriented  Eyes: Conjunctivae are normal.   Nose: No congestion/rhinnorhea. Mouth/Throat: Mucous membranes are moist.    Cardiovascular: Normal rate, regular rhythm. Grossly normal heart sounds.  Good peripheral circulation. Respiratory: Normal respiratory effort.  No retractions. Lungs CTAB. Gastrointestinal: Significant tenderness in the right lower quadrant. No distention.    Musculoskeletal: No lower extremity tenderness nor edema.  Warm and well perfused Neurologic:  Normal speech and language. No gross focal neurologic deficits are appreciated.  Skin:  Skin is warm, dry and intact. No rash noted. Psychiatric: Mood and affect are normal. Speech and behavior are normal.  ____________________________________________   LABS (all labs ordered are listed, but only abnormal results are displayed)  Labs Reviewed  COMPREHENSIVE METABOLIC PANEL - Abnormal; Notable for the following components:      Result Value   Calcium 8.7 (*)    ALT 46 (*)    All other components within normal limits  URINALYSIS, COMPLETE (UACMP) WITH MICROSCOPIC - Abnormal; Notable for the following components:   Color, Urine COLORLESS (*)    APPearance CLEAR (*)    Specific Gravity, Urine 1.004 (*)    All other components within normal limits  LIPASE, BLOOD  CBC   ____________________________________________  EKG  None ____________________________________________  RADIOLOGY  CT abdomen pelvis pending ____________________________________________   PROCEDURES  Procedure(s) performed: No  Procedures   Critical Care performed: No ____________________________________________   INITIAL IMPRESSION / ASSESSMENT AND PLAN / ED COURSE  Pertinent labs & imaging results that were available during my care of the patient were reviewed by me and considered in my medical decision making (see chart for details).  Patient presents with significant right lower quadrant tenderness to palpation,  concerning for appendicitis.  Lab work is overall reassuring, will treat with IV morphine and IV Zofran while we await CT abdomen pelvis.  Patient with recent visit for flank pain but assures me this is different pain  CT abdomen pelvis demonstrates mild enteritis no acute findings otherwise.  Patient's lab work is reassuring, reexam the patient does feel better.  Appropriate for discharge at this time, recommend supportive care    ____________________________________________   FINAL CLINICAL IMPRESSION(S) / ED DIAGNOSES  Final diagnoses:  Enteritis  Right lower quadrant abdominal pain        Note:  This document was prepared using Dragon voice recognition software and may include unintentional dictation errors.    Jene EveryKinner, Victorino Fatzinger, MD 08/22/18 [phone]

## 2018-08-22 NOTE — ED Notes (Signed)
Pt in room drinking oral contrast. Will continue to monitor.

## 2018-08-22 NOTE — ED Notes (Signed)
Pt ambulatory to POV without difficulty. VSS. NAD. Discharge instructions, RX and follow up reviewed. All questions and concerns addressed.  

## 2018-08-22 NOTE — ED Notes (Signed)
Pt is back from CT at this time.  

## 2018-10-17 ENCOUNTER — Other Ambulatory Visit: Payer: Self-pay

## 2018-10-17 ENCOUNTER — Emergency Department
Admission: EM | Admit: 2018-10-17 | Discharge: 2018-10-17 | Disposition: A | Discharge status: Home / Self Care | Payer: Managed Care, Other (non HMO) | Attending: Emergency Medicine | Admitting: Emergency Medicine

## 2018-10-17 ENCOUNTER — Encounter: Payer: Self-pay | Admitting: Emergency Medicine

## 2018-10-17 DIAGNOSIS — F17200 Nicotine dependence, unspecified, uncomplicated: Secondary | ICD-10-CM | POA: Insufficient documentation

## 2018-10-17 DIAGNOSIS — K2900 Acute gastritis without bleeding: Secondary | ICD-10-CM | POA: Diagnosis not present

## 2018-10-17 DIAGNOSIS — Z79899 Other long term (current) drug therapy: Secondary | ICD-10-CM | POA: Insufficient documentation

## 2018-10-17 DIAGNOSIS — R509 Fever, unspecified: Secondary | ICD-10-CM | POA: Diagnosis present

## 2018-10-17 DIAGNOSIS — Z7982 Long term (current) use of aspirin: Secondary | ICD-10-CM | POA: Diagnosis not present

## 2018-10-17 LAB — COMPREHENSIVE METABOLIC PANEL
ALT: 40 U/L (ref 0–44)
AST: 23 U/L (ref 15–41)
Albumin: 3.7 g/dL (ref 3.5–5.0)
Alkaline Phosphatase: 59 U/L (ref 38–126)
Anion gap: 4 — ABNORMAL LOW (ref 5–15)
BUN: 7 mg/dL (ref 6–20)
CO2: 27 mmol/L (ref 22–32)
Calcium: 8.9 mg/dL (ref 8.9–10.3)
Chloride: 109 mmol/L (ref 98–111)
Creatinine, Ser: 0.66 mg/dL (ref 0.61–1.24)
GFR calc Af Amer: >60 mL/min (ref >60)
Glucose, Bld: 89 mg/dL (ref 70–99)
Potassium: 4 mmol/L (ref 3.5–5.1)
Sodium: 140 mmol/L (ref 135–145)
Total Bilirubin: 0.7 mg/dL (ref 0.3–1.2)
Total Protein: 6.5 g/dL (ref 6.5–8.1)

## 2018-10-17 LAB — URINALYSIS, COMPLETE (UACMP) WITH MICROSCOPIC
Bacteria, UA: NONE SEEN
Bilirubin Urine: NEGATIVE
GLUCOSE, UA: NEGATIVE mg/dL
Hgb urine dipstick: NEGATIVE
Ketones, ur: NEGATIVE mg/dL
Leukocytes, UA: NEGATIVE
Nitrite: NEGATIVE
Protein, ur: NEGATIVE mg/dL
Specific Gravity, Urine: 1.016 (ref 1.005–1.030)
pH: 6 (ref 5.0–8.0)

## 2018-10-17 LAB — CBC WITH DIFFERENTIAL/PLATELET
Abs Immature Granulocytes: 0.01 10*3/uL (ref 0.00–0.07)
Basophils Absolute: 0 10*3/uL (ref 0.0–0.1)
Basophils Relative: 1 %
EOS PCT: 2 %
Eosinophils Absolute: 0.1 10*3/uL (ref 0.0–0.5)
HCT: 44.5 % (ref 39.0–52.0)
Hemoglobin: 15.1 g/dL (ref 13.0–17.0)
Immature Granulocytes: 0 %
Lymphocytes Relative: 33 %
Lymphs Abs: 2.2 10*3/uL (ref 0.7–4.0)
MCH: 28.2 pg (ref 26.0–34.0)
MCHC: 33.9 g/dL (ref 30.0–36.0)
MCV: 83 fL (ref 80.0–100.0)
Monocytes Absolute: 0.6 10*3/uL (ref 0.1–1.0)
Monocytes Relative: 9 %
Neutro Abs: 3.8 10*3/uL (ref 1.7–7.7)
Neutrophils Relative %: 55 %
Platelets: 204 10*3/uL (ref 150–400)
RBC: 5.36 MIL/uL (ref 4.22–5.81)
RDW: 12.7 % (ref 11.5–15.5)
WBC: 6.8 10*3/uL (ref 4.0–10.5)
nRBC: 0 % (ref 0.0–0.2)

## 2018-10-17 LAB — LIPASE, BLOOD: LIPASE: 21 U/L (ref 11–51)

## 2018-10-17 MED ORDER — SODIUM CHLORIDE 0.9 % IV BOLUS
1000.0000 mL | Freq: Once | INTRAVENOUS | Status: AC
  Administered 2018-10-17: 1000 mL via INTRAVENOUS

## 2018-10-17 MED ORDER — DICYCLOMINE HCL 10 MG PO CAPS: 10.0000 mg | ORAL_CAPSULE | Freq: Three times a day (TID) | ORAL | 0 refills | Status: DC

## 2018-10-17 MED ORDER — ONDANSETRON HCL 4 MG/2ML IJ SOLN
4.0000 mg | Freq: Once | INTRAMUSCULAR | Status: AC
  Administered 2018-10-17: 4 mg via INTRAVENOUS
  Filled 2018-10-17: qty 2

## 2018-10-17 NOTE — ED Triage Notes (Signed)
Fever and body aches x 2 days. Has vomited x 3 during that time. Head congestion.

## 2018-10-17 NOTE — Discharge Instructions (Addendum)
Follow-up with your primary care provider or make an appointment with the gastroenterologist listed on your discharge papers.  A prescription for Bentyl was sent to your pharmacy.  This medication can be taken up to 4 times a day which should help with abdominal cramping.  Do not take today while driving or operating machinery to see if this medication causes any drowsiness.  Avoid alcohol while taking this medication.  Return to the emergency department if any severe worsening of your symptoms or if you see blood in your stools.  Lab work today was all in normal limits.

## 2018-10-17 NOTE — ED Notes (Signed)
See triage note Presents with fever and body aches which started 2 days ago  Afebrile on arrival

## 2018-10-17 NOTE — ED Provider Notes (Signed)
[facility] Emergency Department Provider Note  ____________________________________________   First MD Initiated Contact with Patient 10/17/18 1058     (approximate)  I have reviewed the triage vital signs and the nursing notes.   HISTORY  Chief Complaint Fever and Generalized Body Aches   HPI Luke ChurchKenneth D Dedic Jr. is a 28 y.o. male presents to the ED with complaint of fever and body aches for last 2 days.  Patient states that he vomited 3 times during the last 2 days.  He also complains of head congestion.  Patient states that he has tried to stay hydrated but has discontinued drinking due to vomiting.  Patient states that no one in his family has similar symptoms.  He does admit to alcohol use and had 2 beers on Saturday night.   He states the only medical problem he has is esophagitis for which he takes Nexium.  He last vomited just prior to arrival.  Currently he complains of abdominal cramping and rates his pain as an 8 out of 10.    He was seen for similar symptoms in December and CT scan showed enteritis.  Patient states that he had no symptoms for approximately 4 to 5 weeks until this past weekend.   Past Medical History:  Diagnosis Date  . Anxiety   . CVA (cerebral vascular accident) (HCC)   . GERD (gastroesophageal reflux disease)   . Hyperlipidemia     Patient Active Problem List   Diagnosis Date Noted  . Left-sided weakness 08/19/2017  . GERD (gastroesophageal reflux disease) 08/19/2017  . History of stroke 08/19/2017    Past Surgical History:  Procedure Laterality Date  . NO PAST SURGERIES      Prior to Admission medications   Medication Sig Start Date End Date Taking? Authorizing Provider  aspirin EC 81 MG tablet Take 81 mg by mouth daily.    [provider]  atorvastatin (LIPITOR) 80 MG tablet Take 80 mg by mouth daily.    [provider]  brompheniramine-pseudoephedrine-DM 30-2-10 MG/5ML syrup Take 10 mLs by mouth 4  (four) times daily as needed. Patient not taking: Reported on 08/19/2017 04/17/17   Tommi RumpsSummers, Burlon Centrella L, PA-C  cyclobenzaprine (FLEXERIL) 5 MG tablet Take 1 tablet (5 mg total) by mouth 3 (three) times daily as needed for muscle spasms (do not drive on this med). 07/28/18   Jeanmarie PlantMcShane, James A, MD  dicyclomine (BENTYL) 10 MG capsule Take 1 capsule (10 mg total) by mouth 4 (four) times daily -  before meals and at bedtime for 14 days. 10/17/18 10/31/18  Tommi RumpsSummers, Liana Camerer L, PA-C  esomeprazole (NEXIUM) 20 MG capsule Take 20 mg by mouth daily at 12 noon.    [provider]  HYDROcodone-acetaminophen (NORCO) 5-325 MG tablet Take 1 tablet by mouth every 6 (six) hours as needed for severe pain. Patient not taking: Reported on 08/19/2017 06/14/17   Merrily Brittleifenbark, Neil, MD  ibuprofen (ADVIL,MOTRIN) 600 MG tablet Take 1 tablet (600 mg total) by mouth every 8 (eight) hours as needed. Patient not taking: Reported on 08/19/2017 06/14/17   Merrily Brittleifenbark, Neil, MD  methocarbamol (ROBAXIN) 500 MG tablet Take 1 tablet (500 mg total) by mouth 4 (four) times daily. 09/02/17   Merrily Brittleifenbark, Neil, MD    Allergies Patient has no known allergies.  Family History  Problem Relation Age of Onset  . Diabetes Mother   . Heart attack Mother   . Crohn's disease Father   . Hypertension Father  Social History Social History   Tobacco Use  . Smoking status: Current Every Day Smoker    Packs/day: 0.50    Last attempt to quit: 07/27/2017    Years since quitting: 1.2  . Smokeless tobacco: Former Engineer, water Use Topics  . Alcohol use: Yes    Alcohol/week: 2.0 - 3.0 standard drinks    Types: 2 - 3 Cans of beer per week    Comment: twice per week  . Drug use: No    Review of Systems Constitutional: Subjective fever/chills Eyes: No visual changes. ENT: No sore throat. Cardiovascular: Denies chest pain. Respiratory: Denies shortness of breath. Gastrointestinal: Positive generalized abdominal pain.  Positive nausea,  positive vomiting.  No diarrhea.  No constipation.  Negative for melena or bright red blood. Genitourinary: Negative for dysuria. Musculoskeletal: Negative for back pain. Skin: Negative for rash. Neurological: Negative for headaches, focal weakness or numbness. ___________________________________________   PHYSICAL EXAM:  VITAL SIGNS: ED Triage Vitals  Enc Vitals Group     BP 10/17/18 1032 110/72     Pulse Rate 10/17/18 1032 81     Resp 10/17/18 1032 20     Temp 10/17/18 1032 97.8 F (36.6 C)     Temp Source 10/17/18 1032 Oral     SpO2 10/17/18 1032 100 %     Weight 10/17/18 1033 165 lb (74.8 kg)     Height 10/17/18 1033 5\' 7"  (1.702 m)     Head Circumference --      Peak Flow --      Pain Score 10/17/18 1036 8     Pain Loc --      Pain Edu? --      Excl. in GC? --    Constitutional: Alert and oriented. Well appearing and in no acute distress. Eyes: Conjunctivae are normal.  Head: Atraumatic. Nose: No congestion/rhinnorhea. Mouth/Throat: Mucous membranes are moist.  Oropharynx non-erythematous. Neck: No stridor.   Hematological/Lymphatic/Immunilogical: No cervical lymphadenopathy. Cardiovascular: Normal rate, regular rhythm. Grossly normal heart sounds.  Good peripheral circulation. Respiratory: Normal respiratory effort.  No retractions. Lungs CTAB. Gastrointestinal: Soft with generalized tenderness epigastric and periumbilical area.  No point tenderness or rebound tenderness noted.  Bowel sounds are hyperactive at present.  No distention.  No CVA tenderness. Musculoskeletal: Moves upper and lower extremities without any difficulty.  Normal gait was noted. Neurologic:  Normal speech and language. No gross focal neurologic deficits are appreciated. No gait instability. Skin:  Skin is warm, dry and intact. No rash noted. Psychiatric: Mood and affect are normal. Speech and behavior are normal.  ____________________________________________   LABS (all labs ordered are  listed, but only abnormal results are displayed)  Labs Reviewed  COMPREHENSIVE METABOLIC PANEL - Abnormal; Notable for the following components:      Result Value   Anion gap 4 (*)    All other components within normal limits  URINALYSIS, COMPLETE (UACMP) WITH MICROSCOPIC - Abnormal; Notable for the following components:   Color, Urine YELLOW (*)    APPearance CLEAR (*)    All other components within normal limits  CBC WITH DIFFERENTIAL/PLATELET  LIPASE, BLOOD    PROCEDURES  Procedure(s) performed: None  Procedures  Critical Care performed: No  ____________________________________________   INITIAL IMPRESSION / ASSESSMENT AND PLAN / ED COURSE  As part of my medical decision making, I reviewed the following data within the electronic MEDICAL RECORD NUMBER Notes from prior ED visits and [place] Controlled Substance Database  Patient presents to the ED with  complaint of 2 days of fever, body aches and vomiting.  Patient has been trying to drink liquids without any success.  He states that the last time he had alcohol was on Saturday night at which time he had 2 beers.  He had similar abdominal discomfort in December at which time a CT scan was done to evaluate his abdominal pain.  Enteritis was noted on his CT scan.  Patient was given fluids and Zofran for his nausea.  Patient drove and does not have anyone to pick him up.  Patient did not have any continued vomiting while in the ED.  Patient was discharged with prescription for Bentyl 10 mg 1 tablet 4 times daily.  We discussed possible GI work-up if he continues to have abdominal difficulties unrelated to the fever and body aches.  He was given a follow-up for gastroenterology.  He is to return to the emergency department if any severe worsening of his symptoms or not improving.   ____________________________________________   FINAL CLINICAL IMPRESSION(S) / ED DIAGNOSES  Final diagnoses:  Acute gastritis, presence of bleeding unspecified,  unspecified gastritis type     ED Discharge Orders         Ordered    dicyclomine (BENTYL) 10 MG capsule  3 times daily before meals & bedtime     10/17/18 1545           Note:  This document was prepared using Dragon voice recognition software and may include unintentional dictation errors.    [name], PA-C 10/17/18 1748    Dionne Bucy, MD 10/18/18 [phone]

## 2018-11-15 ENCOUNTER — Other Ambulatory Visit: Payer: Self-pay

## 2018-11-15 ENCOUNTER — Encounter: Payer: Self-pay | Admitting: Emergency Medicine

## 2018-11-15 ENCOUNTER — Emergency Department
Admission: EM | Admit: 2018-11-15 | Discharge: 2018-11-15 | Disposition: A | Discharge status: Home / Self Care | Payer: Managed Care, Other (non HMO) | Attending: Student in an Organized Health Care Education/Training Program | Admitting: Student in an Organized Health Care Education/Training Program

## 2018-11-15 DIAGNOSIS — Z7982 Long term (current) use of aspirin: Secondary | ICD-10-CM | POA: Insufficient documentation

## 2018-11-15 DIAGNOSIS — Z8673 Personal history of transient ischemic attack (TIA), and cerebral infarction without residual deficits: Secondary | ICD-10-CM | POA: Insufficient documentation

## 2018-11-15 DIAGNOSIS — R509 Fever, unspecified: Secondary | ICD-10-CM | POA: Diagnosis present

## 2018-11-15 DIAGNOSIS — J020 Streptococcal pharyngitis: Secondary | ICD-10-CM | POA: Insufficient documentation

## 2018-11-15 LAB — CBC WITH DIFFERENTIAL/PLATELET
Abs Immature Granulocytes: 0.04 10*3/uL (ref 0.00–0.07)
Basophils Absolute: 0.1 10*3/uL (ref 0.0–0.1)
Basophils Relative: 0 %
Eosinophils Absolute: 0.1 10*3/uL (ref 0.0–0.5)
Eosinophils Relative: 1 %
HCT: 44.7 % (ref 39.0–52.0)
HEMOGLOBIN: 15.2 g/dL (ref 13.0–17.0)
Immature Granulocytes: 0 %
LYMPHS PCT: 9 %
Lymphs Abs: 1.2 10*3/uL (ref 0.7–4.0)
MCH: 28 pg (ref 26.0–34.0)
MCHC: 34 g/dL (ref 30.0–36.0)
MCV: 82.5 fL (ref 80.0–100.0)
MONO ABS: 1.1 10*3/uL — AB (ref 0.1–1.0)
Monocytes Relative: 8 %
Neutro Abs: 11.5 10*3/uL — ABNORMAL HIGH (ref 1.7–7.7)
Neutrophils Relative %: 82 %
Platelets: 178 10*3/uL (ref 150–400)
RBC: 5.42 MIL/uL (ref 4.22–5.81)
RDW: 12.7 % (ref 11.5–15.5)
WBC: 14.1 10*3/uL — ABNORMAL HIGH (ref 4.0–10.5)
nRBC: 0 % (ref 0.0–0.2)

## 2018-11-15 LAB — GROUP A STREP BY PCR: Group A Strep by PCR: DETECTED — AB

## 2018-11-15 LAB — INFLUENZA PANEL BY PCR (TYPE A & B)
Influenza A By PCR: NEGATIVE
Influenza B By PCR: NEGATIVE

## 2018-11-15 LAB — MONONUCLEOSIS SCREEN: Mono Screen: NEGATIVE

## 2018-11-15 MED ORDER — AMOXICILLIN 875 MG PO TABS: 875.0000 mg | ORAL_TABLET | Freq: Two times a day (BID) | ORAL | 0 refills | Status: DC

## 2018-11-15 NOTE — ED Provider Notes (Signed)
[facility] Emergency Department Provider Note  ____________________________________________   First MD Initiated Contact with Patient 11/15/18 [phone]     (approximate)  I have reviewed the triage vital signs and the nursing notes.   HISTORY  Chief Complaint Fever and Sore Throat  HPI Luke Conway. is a 28 y.o. male presents to the ED with complaint of fever, sore throat, swollen glands for the last 2 days.  Patient was seen recently in the ED but states that that particular time he did improve and that this is a new complaint.  He denies any known exposure to flu or strep.  Patient has continued to eat and drink as normal.  He has not taken any over-the-counter medication prior to arrival.   He rates pain as an 8 out of 10.   Past Medical History:  Diagnosis Date  . Anxiety   . CVA (cerebral vascular accident) (HCC)   . GERD (gastroesophageal reflux disease)   . Hyperlipidemia     Patient Active Problem List   Diagnosis Date Noted  . Left-sided weakness 08/19/2017  . GERD (gastroesophageal reflux disease) 08/19/2017  . History of stroke 08/19/2017    Past Surgical History:  Procedure Laterality Date  . NO PAST SURGERIES      Prior to Admission medications   Medication Sig Start Date End Date Taking? Authorizing Provider  amoxicillin (AMOXIL) 875 MG tablet Take 1 tablet (875 mg total) by mouth 2 (two) times daily. 11/15/18   Luke Rumps, PA-C  aspirin EC 81 MG tablet Take 81 mg by mouth daily.    [provider]  atorvastatin (LIPITOR) 80 MG tablet Take 80 mg by mouth daily.    [provider]  cyclobenzaprine (FLEXERIL) 5 MG tablet Take 1 tablet (5 mg total) by mouth 3 (three) times daily as needed for muscle spasms (do not drive on this med). 07/28/18   Jeanmarie Plant, MD  dicyclomine (BENTYL) 10 MG capsule Take 1 capsule (10 mg total) by mouth 4 (four) times daily -  before meals and at bedtime for 14 days. 10/17/18  10/31/18  Luke Rumps, PA-C  esomeprazole (NEXIUM) 20 MG capsule Take 20 mg by mouth daily at 12 noon.    [provider]    Allergies Patient has no known allergies.  Family History  Problem Relation Age of Onset  . Diabetes Mother   . Heart attack Mother   . Crohn's disease Father   . Hypertension Father     Social History Social History   Tobacco Use  . Smoking status: Current Every Day Smoker    Packs/day: 0.50    Last attempt to quit: 07/27/2017    Years since quitting: 1.3  . Smokeless tobacco: Former Engineer, water Use Topics  . Alcohol use: Yes    Alcohol/week: 2.0 - 3.0 standard drinks    Types: 2 - 3 Cans of beer per week    Comment: twice per week  . Drug use: No    Review of Systems Constitutional: Subjective fever/chills Eyes: No visual changes. ENT: Positive sore throat. Cardiovascular: Denies chest pain. Respiratory: Denies shortness of breath. Gastrointestinal: No abdominal pain.  No nausea, no vomiting.  Musculoskeletal: Positive for muscle aches. Skin: Negative for rash. Neurological: Negative for headaches, focal weakness or numbness. ___________________________________________   PHYSICAL EXAM:  VITAL SIGNS: ED Triage Vitals  Enc Vitals Group     BP 11/15/18 0738 109/66     Pulse  Rate 11/15/18 0738 87     Resp 11/15/18 0738 16     Temp 11/15/18 0738 (!) 97.4 F (36.3 C)     Temp Source 11/15/18 0738 Oral     SpO2 11/15/18 0738 97 %     Weight 11/15/18 0735 160 lb (72.6 kg)     Height 11/15/18 0735 5\' 7"  (1.702 m)     Head Circumference --      Peak Flow --      Pain Score 11/15/18 0735 8     Pain Loc --      Pain Edu? --      Excl. in GC? --    Constitutional: Alert and oriented. Well appearing and in no acute distress. Eyes: Conjunctivae are normal. PERRL. EOMI. Head: Atraumatic. Nose: No congestion/rhinnorhea.  TMs are clear bilaterally. Mouth/Throat: Mucous membranes are moist.  Oropharynx erythematous without  exudate.  Uvula is midline. Neck: No stridor.   Hematological/Lymphatic/Immunilogical: Positive bilateral tender cervical lymphadenopathy. Cardiovascular: Normal rate, regular rhythm. Grossly normal heart sounds.  Good peripheral circulation. Respiratory: Normal respiratory effort.  No retractions. Lungs CTAB. Gastrointestinal: Soft and nontender. No distention.  Neurologic:  Normal speech and language. No gross focal neurologic deficits are appreciated. No gait instability. Skin:  Skin is warm, dry and intact. No rash noted. Psychiatric: Mood and affect are normal. Speech and behavior are normal.  ____________________________________________   LABS (all labs ordered are listed, but only abnormal results are displayed)  Labs Reviewed  GROUP A STREP BY PCR - Abnormal; Notable for the following components:      Result Value   Group A Strep by PCR DETECTED (*)    All other components within normal limits  CBC WITH DIFFERENTIAL/PLATELET - Abnormal; Notable for the following components:   WBC 14.1 (*)    Neutro Abs 11.5 (*)    Monocytes Absolute 1.1 (*)    All other components within normal limits  INFLUENZA PANEL BY PCR (TYPE A & B)  MONONUCLEOSIS SCREEN    PROCEDURES  Procedure(s) performed (including Critical Care):  Procedures   ____________________________________________   INITIAL IMPRESSION / ASSESSMENT AND PLAN / ED COURSE  As part of my medical decision making, I reviewed the following data within the electronic MEDICAL RECORD NUMBER Notes from prior ED visits and [place] Controlled Substance Database  Patient presents to the ED with complaint of sore throat, fever, body aches and swollen glands for the last 2 days.  Physical exam was concerning for strep pharyngitis and test was positive.  Patient was made aware that the amoxicillin is for 10 days and that he will need to finish the entire course of antibiotics.  He is encouraged to take Tylenol or ibuprofen as needed for throat  pain.  Increase fluids.  Follow-up with his PCP if any continued problems.  ____________________________________________   FINAL CLINICAL IMPRESSION(S) / ED DIAGNOSES  Final diagnoses:  Strep pharyngitis     ED Discharge Orders         Ordered    amoxicillin (AMOXIL) 875 MG tablet  2 times daily     11/15/18 1191           Note:  This document was prepared using Dragon voice recognition software and may include unintentional dictation errors.    Luke Rumps, PA-C 11/15/18 1240    Willy Eddy, MD 11/15/18 1430

## 2018-11-15 NOTE — Discharge Instructions (Signed)
Follow-up with your primary care provider if any continued problems.  Begin taking amoxicillin 875 twice daily for 10 days.  Do not stop taking the antibiotic until completely finished.  You may take Tylenol or ibuprofen as needed for sore throat or fever.  Increase fluids.  You are still contagious for 24 hours after starting the antibiotic.

## 2018-11-15 NOTE — ED Triage Notes (Signed)
Fever, sore throat, swollen glands for about 2 days, body aches as well. NAD.

## 2018-11-28 ENCOUNTER — Encounter: Payer: Self-pay | Admitting: Emergency Medicine

## 2018-11-28 ENCOUNTER — Emergency Department
Admission: EM | Admit: 2018-11-28 | Discharge: 2018-11-28 | Disposition: A | Discharge status: Home / Self Care | Payer: Managed Care, Other (non HMO) | Attending: Emergency Medicine | Admitting: Emergency Medicine

## 2018-11-28 ENCOUNTER — Other Ambulatory Visit: Payer: Self-pay

## 2018-11-28 DIAGNOSIS — K2900 Acute gastritis without bleeding: Secondary | ICD-10-CM

## 2018-11-28 DIAGNOSIS — Z7982 Long term (current) use of aspirin: Secondary | ICD-10-CM | POA: Insufficient documentation

## 2018-11-28 DIAGNOSIS — Z79899 Other long term (current) drug therapy: Secondary | ICD-10-CM | POA: Diagnosis not present

## 2018-11-28 DIAGNOSIS — R1013 Epigastric pain: Secondary | ICD-10-CM | POA: Diagnosis present

## 2018-11-28 DIAGNOSIS — Z87891 Personal history of nicotine dependence: Secondary | ICD-10-CM | POA: Diagnosis not present

## 2018-11-28 DIAGNOSIS — K2970 Gastritis, unspecified, without bleeding: Secondary | ICD-10-CM | POA: Insufficient documentation

## 2018-11-28 LAB — CBC
HCT: 46.9 % (ref 39.0–52.0)
Hemoglobin: 16.1 g/dL (ref 13.0–17.0)
MCH: 27.5 pg (ref 26.0–34.0)
MCHC: 34.3 g/dL (ref 30.0–36.0)
MCV: 80.2 fL (ref 80.0–100.0)
Platelets: 218 10*3/uL (ref 150–400)
RBC: 5.85 MIL/uL — ABNORMAL HIGH (ref 4.22–5.81)
RDW: 12.6 % (ref 11.5–15.5)
WBC: 15.7 10*3/uL — ABNORMAL HIGH (ref 4.0–10.5)
nRBC: 0 % (ref 0.0–0.2)

## 2018-11-28 LAB — COMPREHENSIVE METABOLIC PANEL
ALT: 36 U/L (ref 0–44)
AST: 22 U/L (ref 15–41)
Albumin: 4.6 g/dL (ref 3.5–5.0)
Alkaline Phosphatase: 62 U/L (ref 38–126)
Anion gap: 10 (ref 5–15)
BUN: 17 mg/dL (ref 6–20)
CHLORIDE: 104 mmol/L (ref 98–111)
CO2: 23 mmol/L (ref 22–32)
Calcium: 8.9 mg/dL (ref 8.9–10.3)
Creatinine, Ser: 0.61 mg/dL (ref 0.61–1.24)
GFR calc Af Amer: >60 mL/min (ref >60)
Glucose, Bld: 98 mg/dL (ref 70–99)
Potassium: 3.5 mmol/L (ref 3.5–5.1)
Sodium: 137 mmol/L (ref 135–145)
Total Bilirubin: 1.1 mg/dL (ref 0.3–1.2)
Total Protein: 7.2 g/dL (ref 6.5–8.1)

## 2018-11-28 LAB — LIPASE, BLOOD: Lipase: 25 U/L (ref 11–51)

## 2018-11-28 IMAGING — DX DG CHEST 1V
1 series · 1 of 1 positions shown · non-contrast
Comparison: 11/01/2015

CLINICAL DATA: Right-sided chest pain for 2 days.

EXAM:
CHEST 1 VIEW

[chest ap]
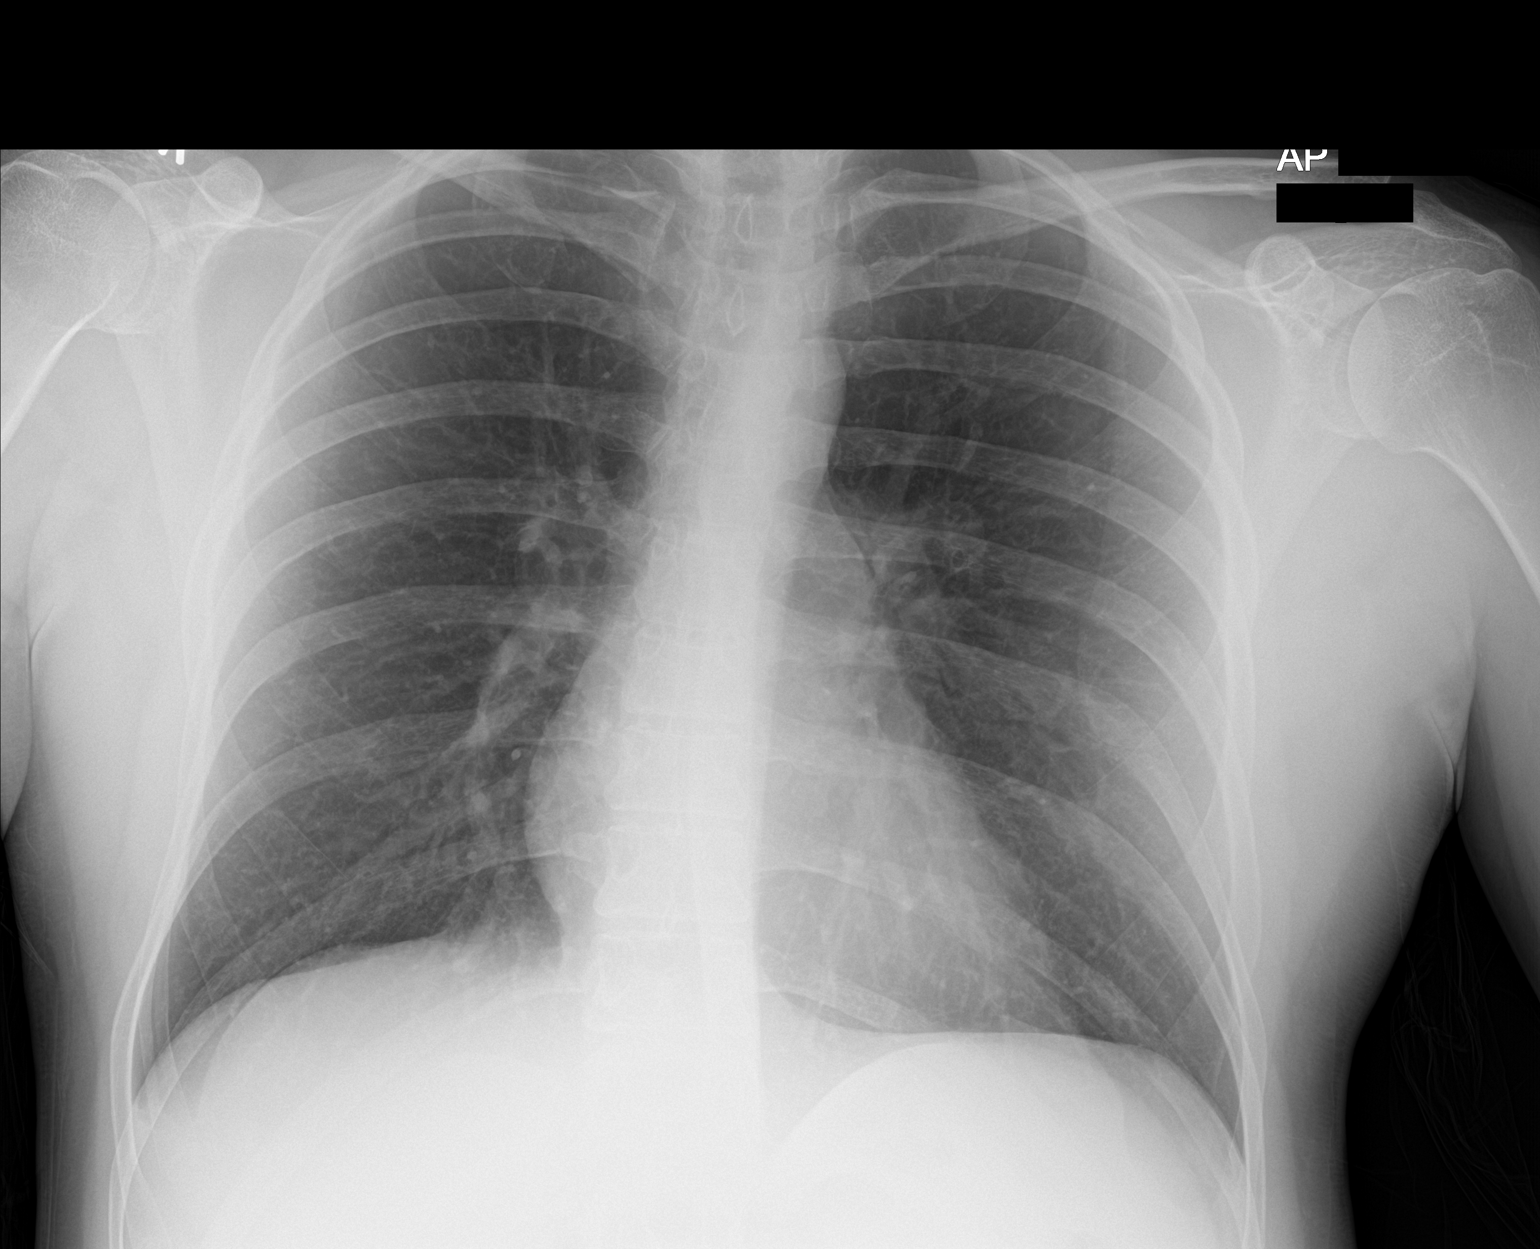

[1 of 1 positions shown; findings below may reference images not displayed]

FINDINGS: The cardiomediastinal contours are normal. The lungs are clear.
Pulmonary vasculature is normal. No consolidation, pleural effusion,
or pneumothorax. No acute osseous abnormalities are seen.
IMPRESSION: Clear lungs.  No acute abnormality.

## 2018-11-28 MED ORDER — PANTOPRAZOLE SODIUM 20 MG PO TBEC: 20.0000 mg | DELAYED_RELEASE_TABLET | Freq: Every day | ORAL | 1 refills | Status: DC

## 2018-11-28 MED ORDER — ONDANSETRON 4 MG PO TBDP
4.0000 mg | ORAL_TABLET | Freq: Once | ORAL | Status: AC
  Administered 2018-11-28: 4 mg via ORAL
  Filled 2018-11-28: qty 1

## 2018-11-28 MED ORDER — SODIUM CHLORIDE 0.9% FLUSH
3.0000 mL | Freq: Once | INTRAVENOUS | Status: AC
  Administered 2018-11-28: 3 mL via INTRAVENOUS

## 2018-11-28 MED ORDER — ALUM & MAG HYDROXIDE-SIMETH 200-200-20 MG/5ML PO SUSP
30.0000 mL | Freq: Once | ORAL | Status: AC
  Administered 2018-11-28: 30 mL via ORAL
  Filled 2018-11-28: qty 30

## 2018-11-28 MED ORDER — SUCRALFATE 1 G PO TABS: 1.0000 g | ORAL_TABLET | Freq: Four times a day (QID) | ORAL | 1 refills | Status: DC

## 2018-11-28 MED ORDER — LIDOCAINE VISCOUS HCL 2 % MT SOLN
15.0000 mL | Freq: Once | OROMUCOSAL | Status: AC
  Administered 2018-11-28: 15 mL via ORAL
  Filled 2018-11-28: qty 15

## 2018-11-28 NOTE — ED Triage Notes (Signed)
Pt to ED via POV c/o epigastric abdominal pain and acid reflux since last night. Pt denies any other symptoms at this time. PT is in NAD at this time

## 2018-11-28 NOTE — ED Provider Notes (Signed)
[facility] Emergency Department Provider Note   ____________________________________________    I have reviewed the triage vital signs and the nursing notes.   HISTORY  Chief Complaint Abdominal Pain     HPI Luke Conway. is a 28 y.o. male who presents with complaints of abdominal pain.  Patient describes epigastric burning sensation.  He reports it feels like acid reflux just "worse ".  Has not take anything for this.  No vomiting, positive nausea.  No fevers or chills.  No new medications.  Pain does not radiate.  Past Medical History:  Diagnosis Date  . Anxiety   . CVA (cerebral vascular accident) (HCC)   . GERD (gastroesophageal reflux disease)   . Hyperlipidemia     Patient Active Problem List   Diagnosis Date Noted  . Left-sided weakness 08/19/2017  . GERD (gastroesophageal reflux disease) 08/19/2017  . History of stroke 08/19/2017    Past Surgical History:  Procedure Laterality Date  . NO PAST SURGERIES      Prior to Admission medications   Medication Sig Start Date End Date Taking? Authorizing Provider  amoxicillin (AMOXIL) 875 MG tablet Take 1 tablet (875 mg total) by mouth 2 (two) times daily. 11/15/18   [name], PA-C  aspirin EC 81 MG tablet Take 81 mg by mouth daily.    [provider]  atorvastatin (LIPITOR) 80 MG tablet Take 80 mg by mouth daily.    [provider]  cyclobenzaprine (FLEXERIL) 5 MG tablet Take 1 tablet (5 mg total) by mouth 3 (three) times daily as needed for muscle spasms (do not drive on this med). 07/28/18   Jeanmarie Plant, MD  dicyclomine (BENTYL) 10 MG capsule Take 1 capsule (10 mg total) by mouth 4 (four) times daily -  before meals and at bedtime for 14 days. 10/17/18 10/31/18  [name], PA-C  esomeprazole (NEXIUM) 20 MG capsule Take 20 mg by mouth daily at 12 noon.    [provider]  pantoprazole (PROTONIX) 20 MG tablet Take 1 tablet (20 mg total) by  mouth daily. 11/28/18 11/28/19  Jene Every, MD  sucralfate (CARAFATE) 1 g tablet Take 1 tablet (1 g total) by mouth 4 (four) times daily for 20 days. 11/28/18 12/18/18  Jene Every, MD     Allergies Patient has no known allergies.  Family History  Problem Relation Age of Onset  . Diabetes Mother   . Heart attack Mother   . Crohn's disease Father   . Hypertension Father     Social History Social History   Tobacco Use  . Smoking status: Current Every Day Smoker    Packs/day: 0.50    Last attempt to quit: 07/27/2017    Years since quitting: 1.3  . Smokeless tobacco: Former Engineer, water Use Topics  . Alcohol use: Yes    Alcohol/week: 2.0 - 3.0 standard drinks    Types: 2 - 3 Cans of beer per week    Comment: twice per week  . Drug use: No    Review of Systems  Constitutional: No fever/chills Eyes: No visual changes.  ENT: No sore throat. Cardiovascular: Denies chest pain. Respiratory: Denies shortness of breath. Gastrointestinal: As above Genitourinary: Negative for dysuria. Musculoskeletal: Negative for back pain. Skin: Negative for rash. Neurological: Negative for headaches   ____________________________________________   PHYSICAL EXAM:  VITAL SIGNS: ED Triage Vitals  Enc Vitals Group     BP 11/28/18 1339 114/74  Pulse Rate 11/28/18 1339 86     Resp 11/28/18 1339 16     Temp 11/28/18 1339 97.8 F (36.6 C)     Temp Source 11/28/18 1339 Oral     SpO2 11/28/18 1339 99 %     Weight 11/28/18 1340 74.8 kg (165 lb)     Height 11/28/18 1340 1.702 m (5\' 7" )     Head Circumference --      Peak Flow --      Pain Score 11/28/18 1340 8     Pain Loc --      Pain Edu? --      Excl. in [place]? --     Constitutional: Alert and oriented. N Eyes: Conjunctivae are normal.   Nose: No congestion/rhinnorhea. Mouth/Throat: Mucous membranes are moist.    Cardiovascular: Normal rate, regular rhythm. Grossly normal heart sounds.  Good peripheral  circulation. Respiratory: Normal respiratory effort.  No retractions. Lungs CTAB. Gastrointestinal: Mild epigastric tenderness to palpation.  Soft, nondistended Musculoskeletal: .  Warm and well perfused Neurologic:  Normal speech and language. No gross focal neurologic deficits are appreciated.  Skin:  Skin is warm, dry and intact. No rash noted. Psychiatric: Mood and affect are normal. Speech and behavior are normal.  ____________________________________________   LABS (all labs ordered are listed, but only abnormal results are displayed)  Labs Reviewed  CBC - Abnormal; Notable for the following components:      Result Value   WBC 15.7 (*)    RBC 5.85 (*)    All other components within normal limits  LIPASE, BLOOD  COMPREHENSIVE METABOLIC PANEL  URINALYSIS, COMPLETE (UACMP) WITH MICROSCOPIC   ____________________________________________  EKG  ED ECG REPORT I, Jene Every, the attending physician, personally viewed and interpreted this ECG.  Date: 11/28/2018  Rhythm: normal sinus rhythm QRS Axis: normal Intervals: normal ST/T Wave abnormalities: normal Narrative Interpretation: no evidence of acute ischemia  ____________________________________________  RADIOLOGY  None ____________________________________________   PROCEDURES  Procedure(s) performed: No  Procedures   Critical Care performed: No ____________________________________________   INITIAL IMPRESSION / ASSESSMENT AND PLAN / ED COURSE  Pertinent labs & imaging results that were available during my care of the patient were reviewed by me and considered in my medical decision making (see chart for details).  Patient presents with epigastric discomfort as above.  Differential includes gastritis, PUD, pancreatitis  Lab work is overall reassuring, elevated white blood cell count is nonspecific.  Reassuring exam.  We will trial GI cocktail and p.o. Zofran  Patient with significant improvement  after GI cocktail   ----------------------------------------- 4:18 PM on 11/28/2018 -----------------------------------------  Patient reports nausea has resolved, no further abdominal pain, will discharge with Protonix, Carafate, outpatient follow-up, return precautions discussed    ____________________________________________   FINAL CLINICAL IMPRESSION(S) / ED DIAGNOSES  Final diagnoses:  Acute gastritis without hemorrhage, unspecified gastritis type        Note:  This document was prepared using Dragon voice recognition software and may include unintentional dictation errors.   Jene Every, MD 11/28/18 [phone]

## 2019-02-02 IMAGING — MR MR HEAD W/O CM
10 series · 44 of 48 positions shown · non-contrast
Comparison: CT HEAD August 19, 2017 at 6161 hours.

CLINICAL DATA: Acute onset numbness and LEFT-sided weakness at 2282
hours while driving at [place]. Receive tPA 2-1/2 weeks ago for
stroke, negative subsequent workup. Assess stroke.

EXAM:
MRI HEAD WITHOUT CONTRAST
TECHNIQUE: Multiplanar, multiecho pulse sequences of the brain and surrounding
structures were obtained without intravenous contrast.

[Series 2: GRE · sagittal · 5.0mm · 0.45mm/px · 2 of 22 slices shown (1 of 2)]
[im 1/22]
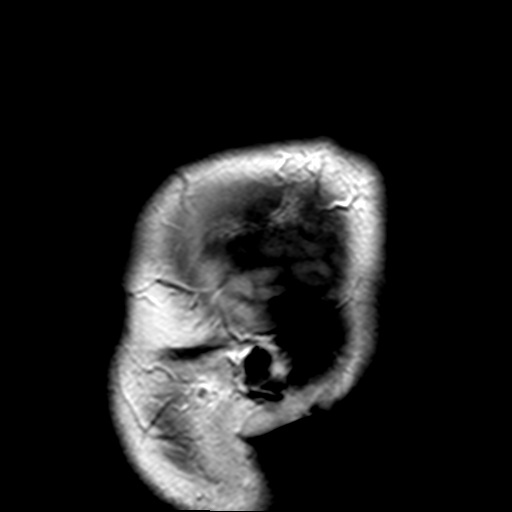
[im 22/22]
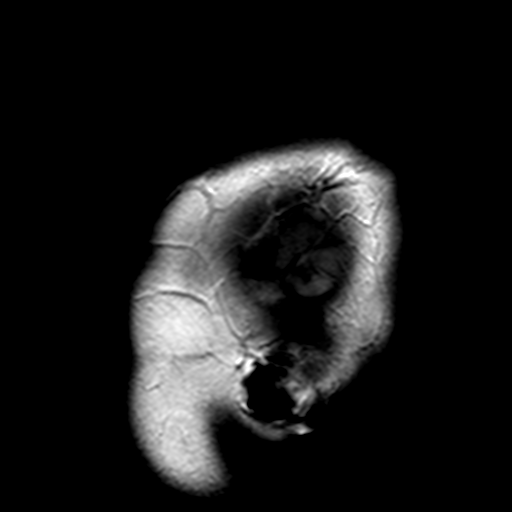

[Series 4: DWI · axial · 3.0mm · 1.80mm/px · z∈[-63,+90]mm · 7 of 52 slices shown (1 of 2)]
[im 1/52]
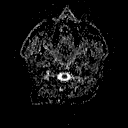
[im 9/52]
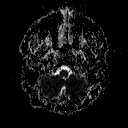
[im 18/52]
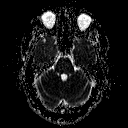
[im 26/52]
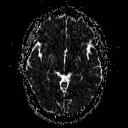
[im 35/52]
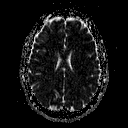
[im 43/52]
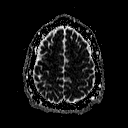
[im 52/52]
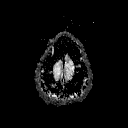

[Series 6: DWI · coronal · 3.0mm · 1.80mm/px · 6 of 46 slices shown (2 of 2)]
[im 1/46]
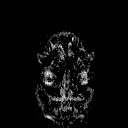
[im 10/46]
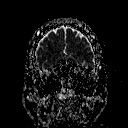
[im 19/46]
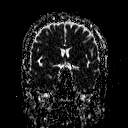
[im 28/46]
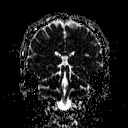
[im 37/46]
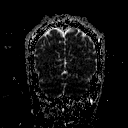
[im 46/46]
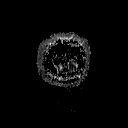

[Series 7: T2 · axial · 5.0mm · 0.45mm/px · z∈[-64,+91]mm · 3 of 25 slices shown (1 of 3)]
[im 1/25]
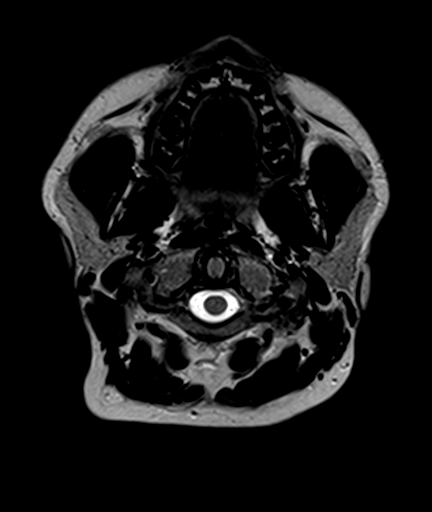
[im 13/25]
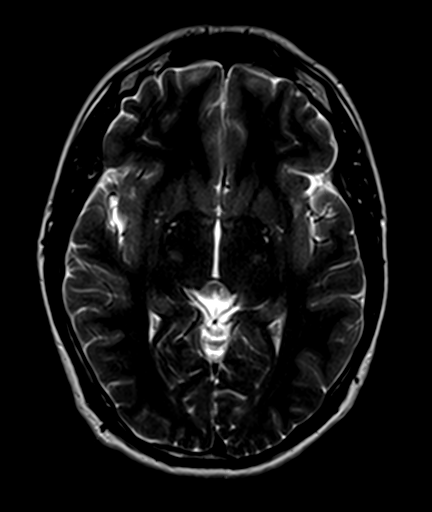
[im 25/25]
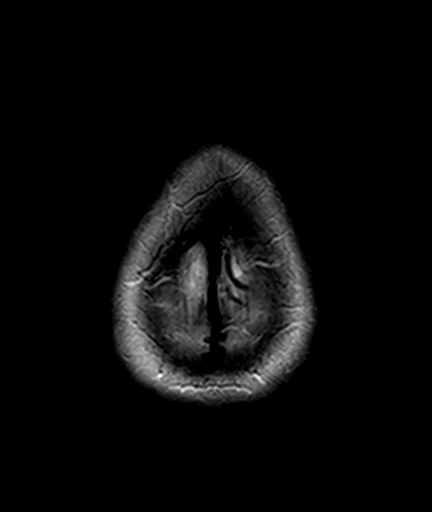

[Series 8: FLAIR · axial · 3.0mm · 0.45mm/px · z∈[-64,+91]mm · 7 of 53 slices shown]
[im 1/53]
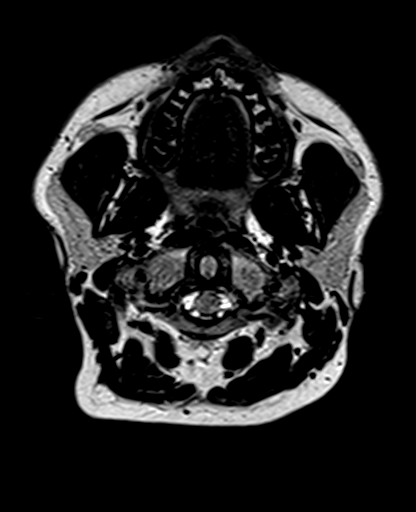
[im 9/53]
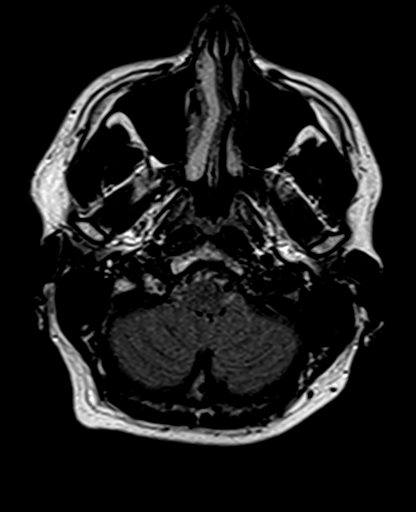
[im 18/53]
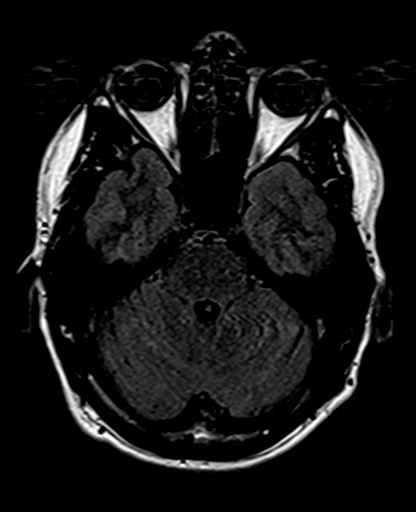
[im 27/53]
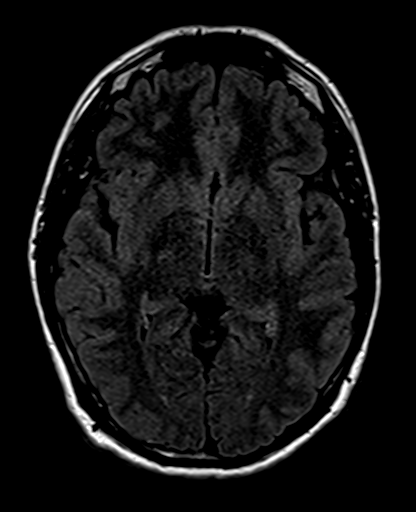
[im 35/53]
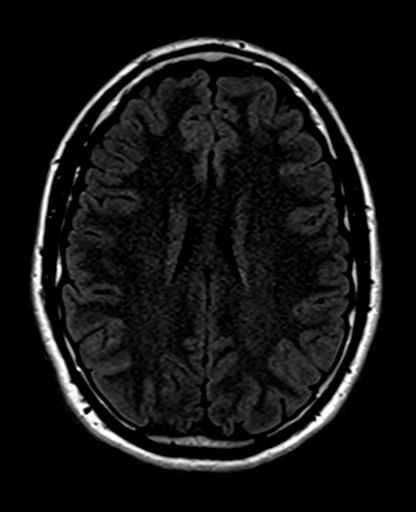
[im 44/53]
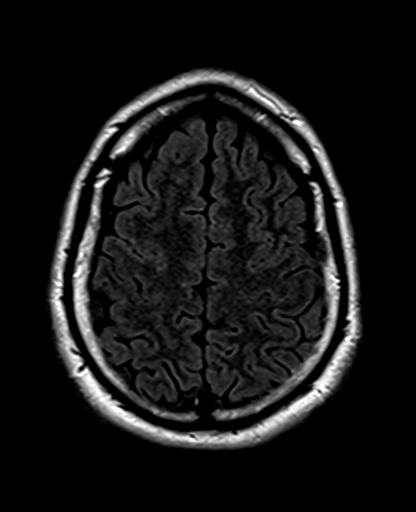
[im 53/53]
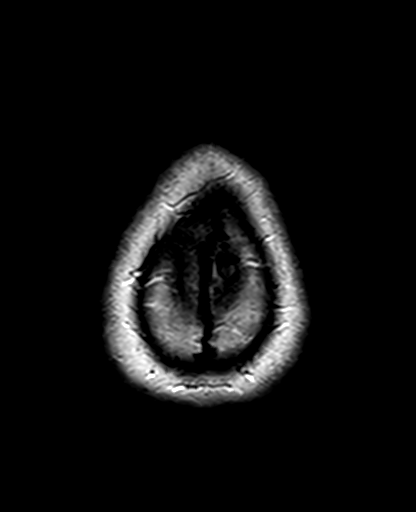

[Series 9: T2 · axial · 5.0mm · 1.20mm/px · z∈[-64,+91]mm · 3 of 25 slices shown (2 of 3)]
[im 1/25]
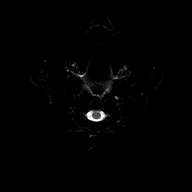
[im 13/25]
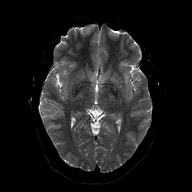
[im 25/25]
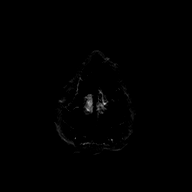

[Series 10: GRE · axial · 5.0mm · 0.45mm/px · z∈[-64,+91]mm · 3 of 25 slices shown (2 of 2)]
[im 1/25]
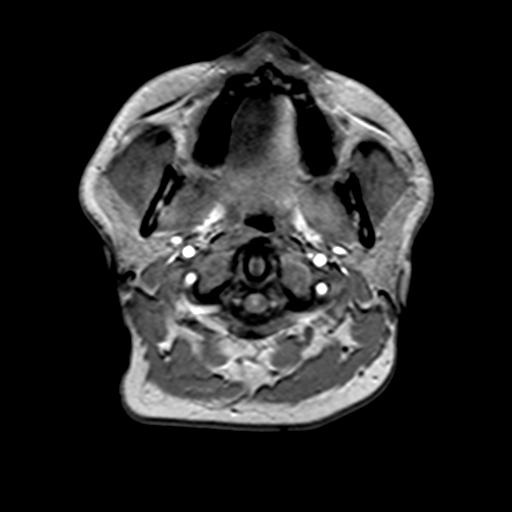
[im 13/25]
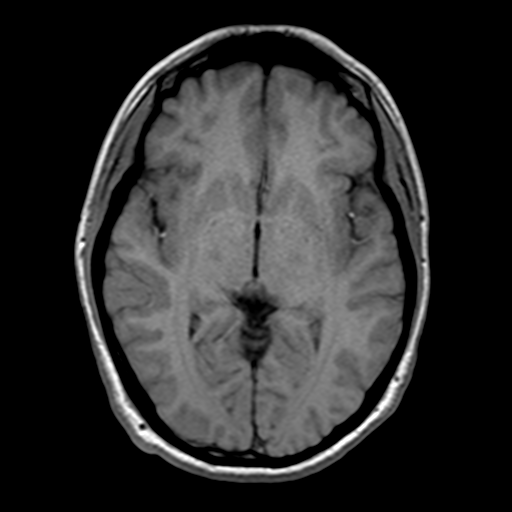
[im 25/25]
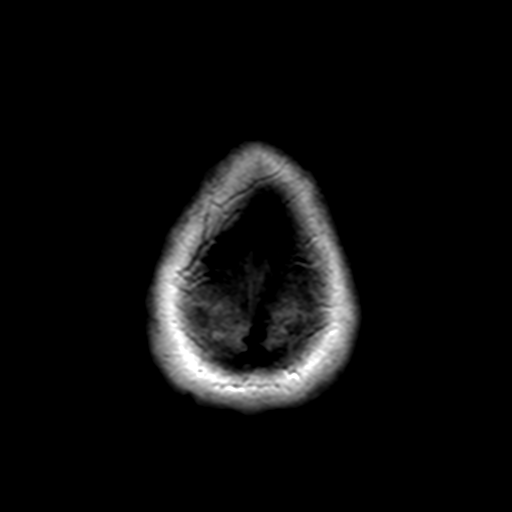

[Series 11: T2 · coronal · 5.0mm · 0.45mm/px · 4 of 28 slices shown (3 of 3)]
[im 1/28]
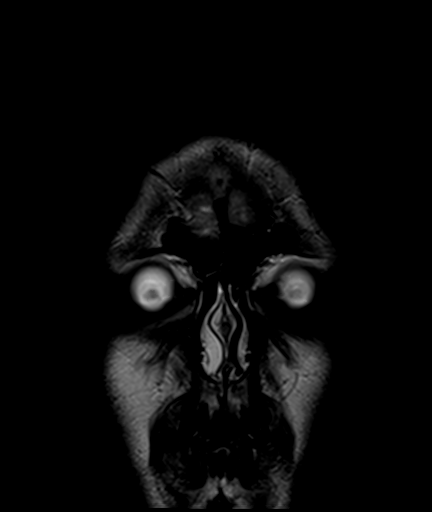
[im 10/28]
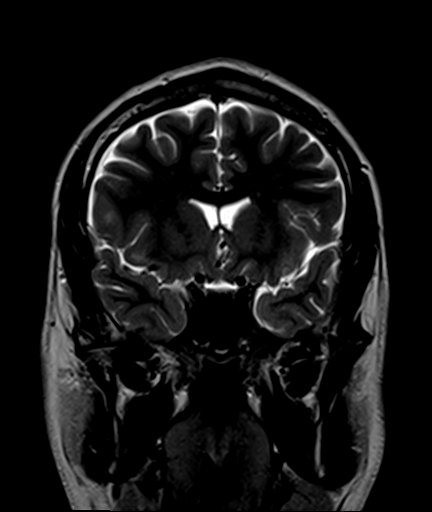
[im 19/28]
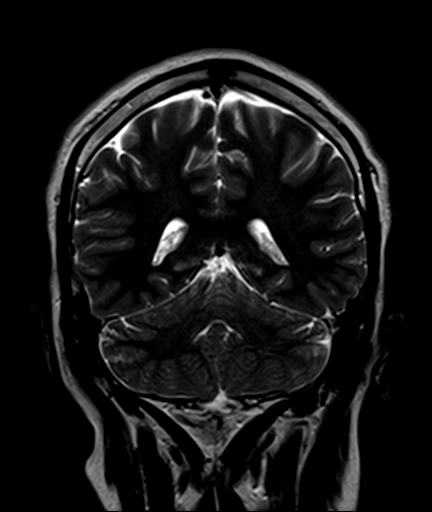
[im 28/28]
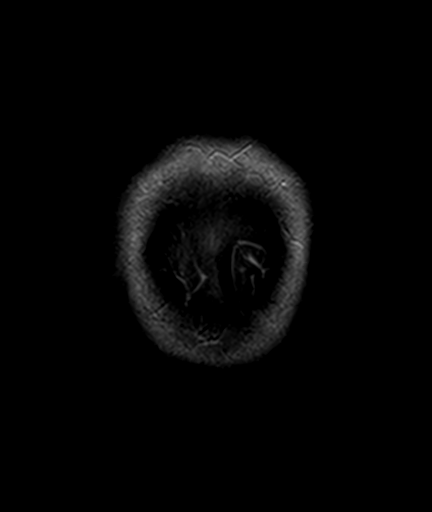

[Series 100: cor (id) · coronal · 3.0mm · 1.80mm/px · 6 of 46 slices shown]
[im 1/46]
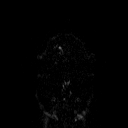
[im 10/46]
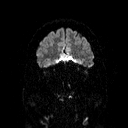
[im 19/46]
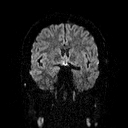
[im 28/46]
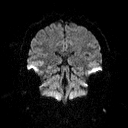
[im 37/46]
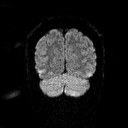
[im 46/46]
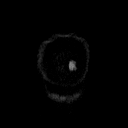

[Series 101: ax (id) · axial · 3.0mm · 1.80mm/px · z∈[-63,-12]mm · 3 of 52 slices shown]
[im 1/52]
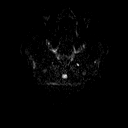
[im 9/52]
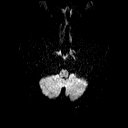
[im 18/52]
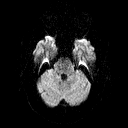

[44 of 48 positions shown; findings below may reference images not displayed]

MRI of the
head report dated August 02, 2017, images are not available for
direct comparison.
FINDINGS: BRAIN: No reduced diffusion to suggest acute ischemia. No
susceptibility artifact to suggest hemorrhage. The ventricles and
sulci are normal for patient's age. LEFT cerebellar developmental
venous anomaly better demonstrated on prior imaging. No suspicious
parenchymal signal, mass or mass effect. No abnormal extra-axial
fluid collections.

VASCULAR: Normal major intracranial vascular flow voids present at
skull base.

SKULL AND UPPER CERVICAL SPINE: No abnormal sellar expansion. No
suspicious calvarial bone marrow signal. Craniocervical junction
maintained.

SINUSES/ORBITS: The mastoid air-cells and included paranasal sinuses
are well-aerated. The included ocular globes and orbital contents
are non-suspicious.

OTHER: None.
IMPRESSION: 1. No acute intracranial process.
2. LEFT cerebellar DVA, otherwise unremarkable noncontrast MRI head.

## 2019-02-02 IMAGING — CT CT ANGIO HEAD
2 of 7 series · 8 of 33 positions shown · IV contrast (APPLIED)
Comparison: Noncontrast head CT earlier today

CLINICAL DATA: Left-sided facial, arm, and leg numbness.

EXAM:
CT ANGIOGRAPHY HEAD AND NECK
TECHNIQUE: Multidetector CT imaging of the head and neck was performed using
the standard protocol during bolus administration of intravenous
contrast. Multiplanar CT image reconstructions and MIPs were
obtained to evaluate the vascular anatomy. Carotid stenosis
measurements (when applicable) are obtained utilizing NASCET
criteria, using the distal internal carotid diameter as the
denominator.
CONTRAST:  75mL KVNEKO-1J5 IOPAMIDOL (KVNEKO-1J5) INJECTION 76%

[Series 5: cta head neck · axial · 0.54mm/px · z∈[-266,-138]mm · 2 of 193 slices shown]
[im 65/193  soft-tissue]
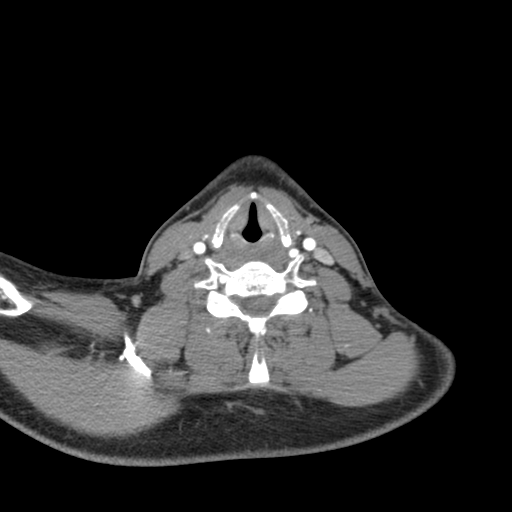
[im 129/193  bone]
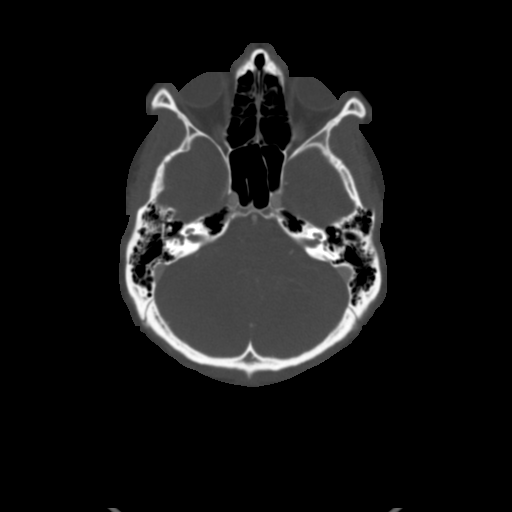

[Series 7: ax thin · axial · 0.49mm/px · z∈[-340,-69]mm · 6 of 381 slices shown]
[im 55/381  soft-tissue]
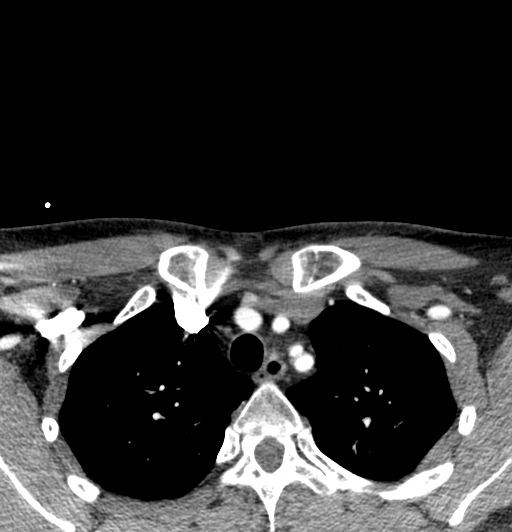
[im 109/381  soft-tissue]
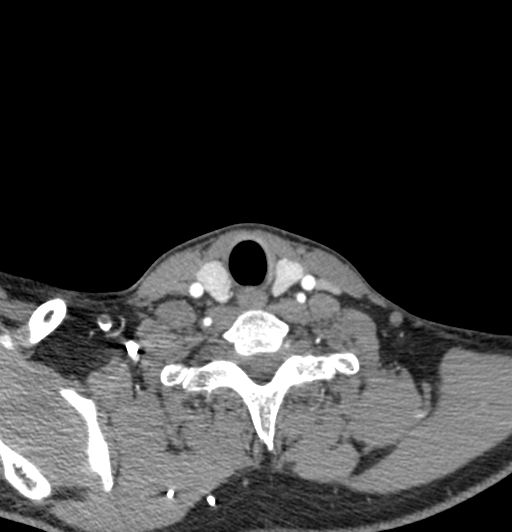
[im 163/381  soft-tissue]
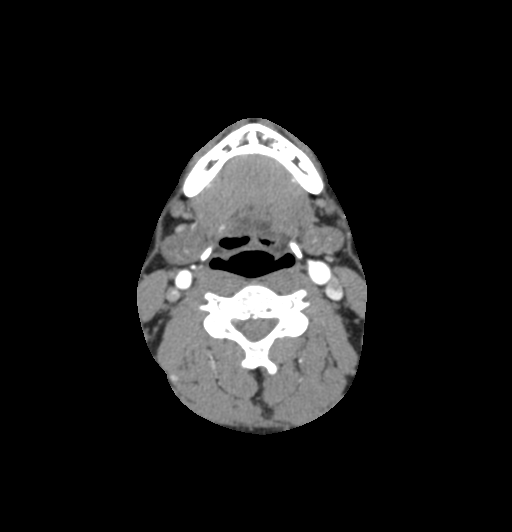
[im 218/381  soft-tissue]
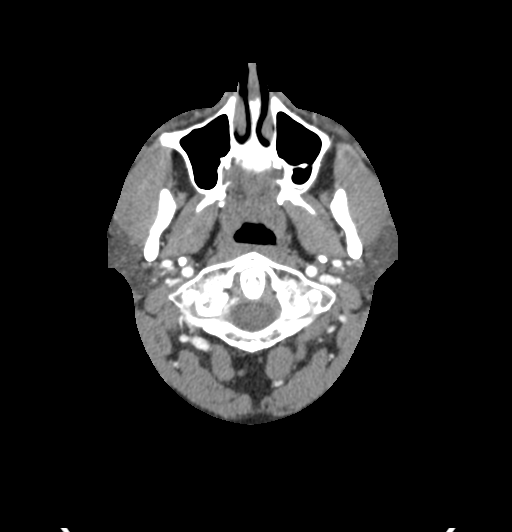
[im 272/381  soft-tissue]
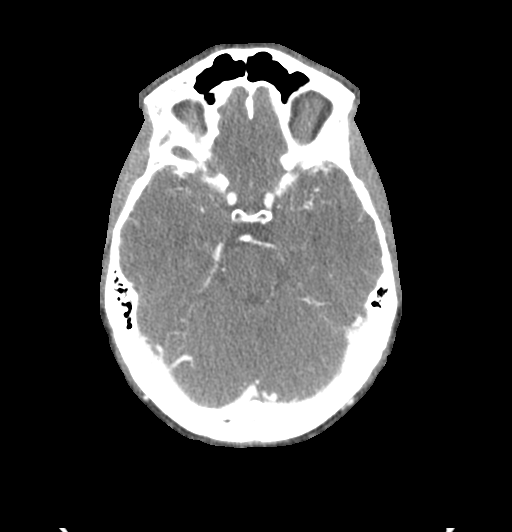
[im 326/381  soft-tissue]
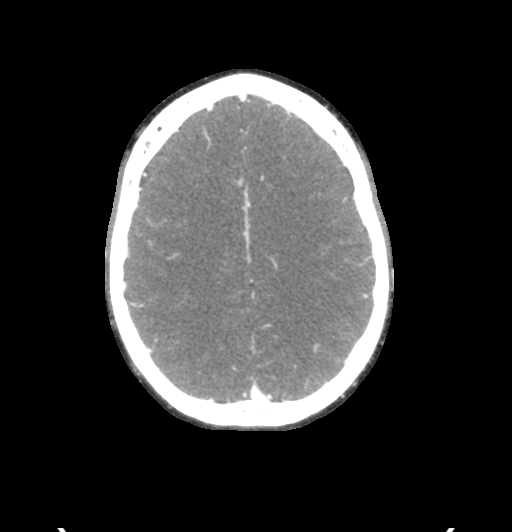

[8 of 33 positions shown; findings below may reference images not displayed]

FINDINGS: CTA NECK FINDINGS

Aortic arch: Normal diameter and appearance.  Four vessel branching.

Right carotid system: Vessels are smooth and widely patent. No
atheromatous changes.

Left carotid system: Vessels are smooth and widely patent. No
atheromatous changes.

Vertebral arteries: No proximal right subclavian or brachiocephalic
stenosis. Codominant vertebral arteries that are smooth and widely
patent to the dura.

Skeleton: Normal appearance

Other neck: No incidental mass or inflammation.

Upper chest: Negative

Review of the MIP images confirms the above findings

CTA HEAD FINDINGS

Anterior circulation: Symmetric carotid arteries. Dominant left A1
segment with anterior communicating artery present. No branch
occlusion, stenosis, or beading. Negative for aneurysm.

Posterior circulation: Vertebral and basilar arteries are smooth and
widely patent. Symmetric good patency of bilateral posterior
cerebral arteries. Negative for aneurysm.

Venous sinuses: Patent. Incidental developmental venous anomaly in
the left cerebellum.

Anatomic variants: None significant

Delayed phase: Not performed in the emergent setting.

Review of the MIP images confirms the above findings
IMPRESSION: Normal CTA of the head and neck.

## 2019-04-15 ENCOUNTER — Other Ambulatory Visit: Payer: Self-pay

## 2019-04-15 DIAGNOSIS — Z7982 Long term (current) use of aspirin: Secondary | ICD-10-CM | POA: Insufficient documentation

## 2019-04-15 DIAGNOSIS — K5000 Crohn's disease of small intestine without complications: Secondary | ICD-10-CM | POA: Insufficient documentation

## 2019-04-15 DIAGNOSIS — Z8673 Personal history of transient ischemic attack (TIA), and cerebral infarction without residual deficits: Secondary | ICD-10-CM | POA: Insufficient documentation

## 2019-04-15 DIAGNOSIS — Z79899 Other long term (current) drug therapy: Secondary | ICD-10-CM | POA: Insufficient documentation

## 2019-04-15 DIAGNOSIS — F17200 Nicotine dependence, unspecified, uncomplicated: Secondary | ICD-10-CM | POA: Insufficient documentation

## 2019-04-15 LAB — URINALYSIS, COMPLETE (UACMP) WITH MICROSCOPIC
Bacteria, UA: NONE SEEN
Bilirubin Urine: NEGATIVE
Glucose, UA: NEGATIVE mg/dL
Hgb urine dipstick: NEGATIVE
Ketones, ur: NEGATIVE mg/dL
Leukocytes,Ua: NEGATIVE
Nitrite: NEGATIVE
Protein, ur: NEGATIVE mg/dL
Specific Gravity, Urine: 1.019 (ref 1.005–1.030)
Squamous Epithelial / HPF: NONE SEEN (ref 0–5)
WBC, UA: NONE SEEN WBC/hpf (ref 0–5)
pH: 7 (ref 5.0–8.0)

## 2019-04-15 LAB — BASIC METABOLIC PANEL
Anion gap: 7 (ref 5–15)
BUN: 15 mg/dL (ref 6–20)
CO2: 23 mmol/L (ref 22–32)
Calcium: 8.8 mg/dL — ABNORMAL LOW (ref 8.9–10.3)
Chloride: 112 mmol/L — ABNORMAL HIGH (ref 98–111)
Creatinine, Ser: 0.68 mg/dL (ref 0.61–1.24)
GFR calc Af Amer: >60 mL/min (ref >60)
GFR calc non Af Amer: >60 mL/min (ref >60)
Glucose, Bld: 123 mg/dL — ABNORMAL HIGH (ref 70–99)
Potassium: 3.5 mmol/L (ref 3.5–5.1)
Sodium: 142 mmol/L (ref 135–145)

## 2019-04-15 LAB — CBC
HCT: 42.5 % (ref 39.0–52.0)
Hemoglobin: 14.8 g/dL (ref 13.0–17.0)
MCH: 27.9 pg (ref 26.0–34.0)
MCHC: 34.8 g/dL (ref 30.0–36.0)
MCV: 80.2 fL (ref 80.0–100.0)
Platelets: 219 10*3/uL (ref 150–400)
RBC: 5.3 MIL/uL (ref 4.22–5.81)
RDW: 12.7 % (ref 11.5–15.5)
WBC: 9.8 10*3/uL (ref 4.0–10.5)
nRBC: 0 % (ref 0.0–0.2)

## 2019-04-15 NOTE — ED Triage Notes (Signed)
Pt ambulatory to triage with no difficulty. Pt reports pain to his right flank region that started on Thursday. Has been a dull pain until yesterday in the afternoon and pain become worse. Last BM today around 3 BM was normal. Pt reports noticed blood in his urine this morning. Pt is tender over right kidney area.

## 2019-04-16 ENCOUNTER — Emergency Department
Admission: EM | Admit: 2019-04-16 | Discharge: 2019-04-16 | Disposition: A | Discharge status: Home / Self Care | Payer: Self-pay | Attending: Emergency Medicine | Admitting: Emergency Medicine

## 2019-04-16 ENCOUNTER — Emergency Department: Payer: Self-pay

## 2019-04-16 DIAGNOSIS — K5000 Crohn's disease of small intestine without complications: Secondary | ICD-10-CM

## 2019-04-16 MED ORDER — MORPHINE SULFATE (PF) 4 MG/ML IV SOLN
4.0000 mg | Freq: Once | INTRAVENOUS | Status: AC
  Administered 2019-04-16: 4 mg via INTRAVENOUS
  Filled 2019-04-16: qty 1

## 2019-04-16 MED ORDER — TRAMADOL HCL 50 MG PO TABS: 50.0000 mg | ORAL_TABLET | Freq: Four times a day (QID) | ORAL | 0 refills | Status: DC | PRN

## 2019-04-16 MED ORDER — ONDANSETRON HCL 4 MG/2ML IJ SOLN
4.0000 mg | Freq: Once | INTRAMUSCULAR | Status: AC
  Administered 2019-04-16: 4 mg via INTRAVENOUS
  Filled 2019-04-16: qty 2

## 2019-04-16 MED ORDER — METHYLPREDNISOLONE SODIUM SUCC 125 MG IJ SOLR
125.0000 mg | Freq: Once | INTRAMUSCULAR | Status: AC
  Administered 2019-04-16: 125 mg via INTRAVENOUS
  Filled 2019-04-16: qty 2

## 2019-04-16 NOTE — ED Notes (Addendum)
Patient c/o PPH abdominal pain radiating towards pelvis beginning Thursday. Patient reports hematuria yesterday. Patient is tender to palpation of abdomen.

## 2019-04-16 NOTE — ED Provider Notes (Signed)
[facility] Emergency Department Provider Note ____   First MD Initiated Contact with Patient 04/16/19 0040     (approximate)  I have reviewed the triage vital signs and the nursing notes.   HISTORY  Chief Complaint Flank Pain   HPI Luke ChurchKenneth D Schedler Jr. is a 28 y.o. male below list of previous medical conditions presents to the emergency department with a 3-day history of right flank pain which patient states is currently 9 out of 10.  Patient denies any nausea vomiting or diarrhea.  Patient denies any constipation.  Patient states that he noted blood in his urine today.  Patient denies any dysuria no increased frequency urgency.        Past Medical History:  Diagnosis Date   Anxiety    CVA (cerebral vascular accident) (HCC)    GERD (gastroesophageal reflux disease)    Hyperlipidemia     Patient Active Problem List   Diagnosis Date Noted   Left-sided weakness 08/19/2017   GERD (gastroesophageal reflux disease) 08/19/2017   History of stroke 08/19/2017    Past Surgical History:  Procedure Laterality Date   NO PAST SURGERIES      Prior to Admission medications   Medication Sig Start Date End Date Taking? Authorizing Provider  amoxicillin (AMOXIL) 875 MG tablet Take 1 tablet (875 mg total) by mouth 2 (two) times daily. 11/15/18   Tommi RumpsSummers, Rhonda L, PA-C  aspirin EC 81 MG tablet Take 81 mg by mouth daily.    [provider]  atorvastatin (LIPITOR) 80 MG tablet Take 80 mg by mouth daily.    [provider]  cyclobenzaprine (FLEXERIL) 5 MG tablet Take 1 tablet (5 mg total) by mouth 3 (three) times daily as needed for muscle spasms (do not drive on this med). 07/28/18   Jeanmarie PlantMcShane, James A, MD  dicyclomine (BENTYL) 10 MG capsule Take 1 capsule (10 mg total) by mouth 4 (four) times daily -  before meals and at bedtime for 14 days. 10/17/18 10/31/18  Tommi RumpsSummers, Rhonda L, PA-C  esomeprazole (NEXIUM) 20 MG capsule Take 20 mg by mouth  daily at 12 noon.    [provider]  pantoprazole (PROTONIX) 20 MG tablet Take 1 tablet (20 mg total) by mouth daily. 11/28/18 11/28/19  Jene EveryKinner, Robert, MD  sucralfate (CARAFATE) 1 g tablet Take 1 tablet (1 g total) by mouth 4 (four) times daily for 20 days. 11/28/18 12/18/18  Jene EveryKinner, Robert, MD    Allergies Patient has no known allergies.  Family History  Problem Relation Age of Onset   Diabetes Mother    Heart attack Mother    Crohn's disease Father    Hypertension Father     Social History Social History   Tobacco Use   Smoking status: Current Every Day Smoker    Packs/day: 0.50    Last attempt to quit: 07/27/2017    Years since quitting: 1.7   Smokeless tobacco: Former NeurosurgeonUser  Substance Use Topics   Alcohol use: Yes    Alcohol/week: 2.0 - 3.0 standard drinks    Types: 2 - 3 Cans of beer per week    Comment: twice per week   Drug use: No    Review of Systems Constitutional: No fever/chills Eyes: No visual changes. ENT: No sore throat. Cardiovascular: Denies chest pain. Respiratory: Denies shortness of breath. Gastrointestinal: Positive for abdominal pain.  No nausea, no vomiting.  No diarrhea.  No constipation. Genitourinary: Negative for dysuria. Musculoskeletal: Negative for neck pain.  Negative for back pain. Integumentary: Negative for rash. Neurological: Negative for headaches, focal weakness or numbness.   ____________________________________________   PHYSICAL EXAM:  VITAL SIGNS: ED Triage Vitals  Enc Vitals Group     BP 04/15/19 2048 133/70     Pulse Rate 04/15/19 2048 96     Resp 04/15/19 2048 18     Temp 04/15/19 2048 98.6 F (37 C)     Temp Source 04/15/19 2048 Oral     SpO2 04/15/19 2048 97 %     Weight 04/15/19 2049 77.1 kg (170 lb)     Height 04/15/19 2049 1.702 m (5\' 7" )     Head Circumference --      Peak Flow --      Pain Score 04/15/19 2049 10     Pain Loc --      Pain Edu? --      Excl. in [place]? --     Constitutional:  Alert and oriented. Well appearing and in no acute distress. Eyes: Conjunctivae are normal.  Mouth/Throat: Mucous membranes are moist.  Oropharynx non-erythematous. Neck: No stridor.   Cardiovascular: Normal rate, regular rhythm. Good peripheral circulation. Grossly normal heart sounds. Respiratory: Normal respiratory effort.  No retractions. No audible wheezing. Gastrointestinal: Right lower quadrant tenderness to palpation.  No distention.  Musculoskeletal: No lower extremity tenderness nor edema. No gross deformities of extremities. Neurologic:  Normal speech and language. No gross focal neurologic deficits are appreciated.  Skin:  Skin is warm, dry and intact. No rash noted. Psychiatric: Mood and affect are normal. Speech and behavior are normal.  ____________________________________________   LABS (all labs ordered are listed, but only abnormal results are displayed)  Labs Reviewed  URINALYSIS, COMPLETE (UACMP) WITH MICROSCOPIC - Abnormal; Notable for the following components:      Result Value   Color, Urine YELLOW (*)    APPearance CLOUDY (*)    All other components within normal limits  BASIC METABOLIC PANEL - Abnormal; Notable for the following components:   Chloride 112 (*)    Glucose, Bld 123 (*)    Calcium 8.8 (*)    All other components within normal limits  CBC   _  RADIOLOGY I, Underwood N Nubia Ziesmer, personally viewed and evaluated these images (plain radiographs) as part of my medical decision making, as well as reviewing the written report by the radiologist.  ED MD interpretation: Findings cysts just above terminal ileitis versus mesenteric adenitis with "normal appendix" per radiologist on interpretation of CT abdomen pelvis.  Official radiology report(s): Ct Renal Stone Study  Result Date: 04/16/2019 CLINICAL DATA:  Flank pain.  Right-sided flank pain. EXAM: CT ABDOMEN AND PELVIS WITHOUT CONTRAST TECHNIQUE: Multidetector CT imaging of the abdomen and pelvis was  performed following the standard protocol without IV contrast. COMPARISON:  CT dated August 22, 2018 FINDINGS: Lower chest: The lung bases are clear. The heart size is normal. Hepatobiliary: The liver is normal. Normal gallbladder.There is no biliary ductal dilation. Pancreas: Normal contours without ductal dilatation. No peripancreatic fluid collection. Spleen: No splenic laceration or hematoma. Adrenals/Urinary Tract: --Adrenal glands: No adrenal hemorrhage. --Right kidney/ureter: No hydronephrosis or perinephric hematoma. --Left kidney/ureter: No hydronephrosis or perinephric hematoma. --Urinary bladder: Unremarkable. Stomach/Bowel: --Stomach/Duodenum: No hiatal hernia or other gastric abnormality. Normal duodenal course and caliber. --Small bowel: There may be some wall thickening of the terminal ileum with adjacent mild inflammatory changes. --Colon: No focal abnormality. --Appendix: Normal. Vascular/Lymphatic: Normal course and caliber of the major abdominal vessels. --No retroperitoneal lymphadenopathy. --  there are prominent lymph nodes in the right lower quadrant which is new from prior study. --No pelvic or inguinal lymphadenopathy. Reproductive: Unremarkable Other: No ascites or free air. The abdominal wall is normal. Musculoskeletal. No acute displaced fractures. IMPRESSION: 1. Findings suggestive of terminal ileitis or mesenteric adenitis. 2. Normal appendix in the right lower quadrant. 3. No radiopaque kidney stones. Electronically Signed   By: Katherine Mantlehristopher  Green M.D.   On: 04/16/2019 00:21      Procedures   ____________________________________________   INITIAL IMPRESSION / MDM / ASSESSMENT AND PLAN / ED COURSE  As part of my medical decision making, I reviewed the following data within the electronic MEDICAL RECORD NUMBER   28 year old male presenting with above-stated history and physical exam secondary to right flank/right lower quadrant abdominal pain.  Laboratory data unremarkable.  CT  abdomen pelvis consistent with terminal ileitis versus mesenteric adenitis with a "normal appendix" per radiologist.  Patient has a familial history (father) with Crohn's disease.  I spoke with the patient at length regarding the necessity of following up with Dr. Servando SnareWohl from gastroenterology for further outpatient evaluation.  Patient given IV morphine Zofran and Solu-Medrol in the emergency department.  Patient prescribed tramadol for home  ____________________________________________  FINAL CLINICAL IMPRESSION(S) / ED DIAGNOSES  Final diagnoses:  Terminal ileitis without complication (HCC)     MEDICATIONS GIVEN DURING THIS VISIT:  Medications  morphine 4 MG/ML injection 4 mg (has no administration in time range)  ondansetron (ZOFRAN) injection 4 mg (has no administration in time range)  methylPREDNISolone sodium succinate (SOLU-MEDROL) 125 mg/2 mL injection 125 mg (has no administration in time range)     ED Discharge Orders    None      *Please note:  Luke ChurchKenneth D Vallandingham Jr. was evaluated in Emergency Department on 04/16/2019 for the symptoms described in the history of present illness. He was evaluated in the context of the global COVID-19 pandemic, which necessitated consideration that the patient might be at risk for infection with the SARS-CoV-2 virus that causes COVID-19. Institutional protocols and algorithms that pertain to the evaluation of patients at risk for COVID-19 are in a state of rapid change based on information released by regulatory bodies including the CDC and federal and state organizations. These policies and algorithms were followed during the patient's care in the ED.  Some ED evaluations and interventions may be delayed as a result of limited staffing during the pandemic.*  Note:  This document was prepared using Dragon voice recognition software and may include unintentional dictation errors.   Darci CurrentBrown, Emmett N, MD 04/16/19 [phone]

## 2019-04-16 NOTE — ED Notes (Signed)
Reviewed discharge instructions, follow-up care, and prescriptions with patient. Patient verbalized understanding of all information reviewed. Patient stable, with no distress noted at this time.    

## 2019-04-16 NOTE — ED Notes (Signed)
Patient given update. 

## 2019-04-16 NOTE — ED Notes (Signed)
ED Provider at bedside. 

## 2019-05-15 ENCOUNTER — Ambulatory Visit (INDEPENDENT_AMBULATORY_CARE_PROVIDER_SITE_OTHER): Payer: Self-pay | Admitting: Gastroenterology

## 2019-05-15 ENCOUNTER — Other Ambulatory Visit: Payer: Self-pay

## 2019-05-15 ENCOUNTER — Encounter: Payer: Self-pay | Admitting: Gastroenterology

## 2019-05-15 VITALS — BP 103/67 | HR 99 | Temp 97.9°F | Ht 67.0 in | Wt 169.6 lb

## 2019-05-15 DIAGNOSIS — R933 Abnormal findings on diagnostic imaging of other parts of digestive tract: Secondary | ICD-10-CM

## 2019-05-15 DIAGNOSIS — F419 Anxiety disorder, unspecified: Secondary | ICD-10-CM | POA: Insufficient documentation

## 2019-05-15 DIAGNOSIS — G8929 Other chronic pain: Secondary | ICD-10-CM

## 2019-05-15 DIAGNOSIS — R1031 Right lower quadrant pain: Secondary | ICD-10-CM

## 2019-05-15 NOTE — H&P (View-Only) (Signed)
Gastroenterology Consultation  Referring Provider:     Derinda Late, MD Primary Care Physician:  Derinda Late, MD Primary Gastroenterologist:  Dr. Allen Norris     Reason for Consultation:     Abdominal pain        HPI:   Luke Bassford. is a 28 y.o. y/o male referred for consultation & management of abdominal pain by Dr. Derinda Late, MD.  This patient comes in today after being seen in the ER at the end of July for right flank pain and abdominal pain.  The patient had reported that he saw some blood in his urine.  The patient was sent off for CT scan to look for renal stones.  CT scan showed:  IMPRESSION: 1. Findings suggestive of terminal ileitis or mesenteric adenitis. 2. Normal appendix in the right lower quadrant. 3. No radiopaque kidney stones.  The patient had been seen in the past by Dr. Rayann Heman in July 2015.  At that time the patient had reported diarrhea.  It appears from his note that the patient had a colonoscopy set up at that time and off campus site.   The patient's CBC at the time he visits to the ER was normal.  Back in March the patient had been seen with a teleconference by his PCP with report of active problems being diarrhea heartburn and abdominal pain.  The patient was started on Lexapro and Nexium and told that if this did not work he could pick up famotidine over-the-counter.  The patient reports that his abdominal pain is almost daily.  He denies any rectal bleeding or diarrhea at the present time.  The patient did have a colonoscopy with the terminal ileum reported to have been normal but no biopsies were taken of the terminal ileum but there were random biopsies throughout the colon that were normal.  Past Medical History:  Diagnosis Date  . Anxiety   . CVA (cerebral vascular accident) ([place])   . GERD (gastroesophageal reflux disease)   . Hyperlipidemia     Past Surgical History:  Procedure Laterality Date  . NO PAST SURGERIES      Prior to  Admission medications   Medication Sig Start Date End Date Taking? Authorizing Provider  amoxicillin (AMOXIL) 875 MG tablet Take 1 tablet (875 mg total) by mouth 2 (two) times daily. 11/15/18   Johnn Hai, PA-C  aspirin EC 81 MG tablet Take 81 mg by mouth daily.    [provider]  atorvastatin (LIPITOR) 80 MG tablet Take 80 mg by mouth daily.    [provider]  cyclobenzaprine (FLEXERIL) 5 MG tablet Take 1 tablet (5 mg total) by mouth 3 (three) times daily as needed for muscle spasms (do not drive on this med). 07/28/18   Schuyler Amor, MD  dicyclomine (BENTYL) 10 MG capsule Take 1 capsule (10 mg total) by mouth 4 (four) times daily -  before meals and at bedtime for 14 days. 10/17/18 10/31/18  Johnn Hai, PA-C  esomeprazole (NEXIUM) 20 MG capsule Take 20 mg by mouth daily at 12 noon.    [provider]  pantoprazole (PROTONIX) 20 MG tablet Take 1 tablet (20 mg total) by mouth daily. 11/28/18 11/28/19  Lavonia Drafts, MD  sucralfate (CARAFATE) 1 g tablet Take 1 tablet (1 g total) by mouth 4 (four) times daily for 20 days. 11/28/18 12/18/18  Lavonia Drafts, MD  traMADol (ULTRAM) 50 MG tablet Take 1 tablet (50 mg total) by mouth  every 6 (six) hours as needed. 04/16/19 04/15/20  Darci CurrentBrown, McDermitt N, MD    Family History  Problem Relation Age of Onset  . Diabetes Mother   . Heart attack Mother   . Crohn's disease Father   . Hypertension Father      Social History   Tobacco Use  . Smoking status: Current Every Day Smoker    Packs/day: 0.50    Last attempt to quit: 07/27/2017    Years since quitting: 1.8  . Smokeless tobacco: Former Engineer, waterUser  Substance Use Topics  . Alcohol use: Yes    Alcohol/week: 2.0 - 3.0 standard drinks    Types: 2 - 3 Cans of beer per week    Comment: twice per week  . Drug use: No    Allergies as of 05/15/2019  . (No Known Allergies)    Review of Systems:    All systems reviewed and negative except where noted in HPI.   Physical  Exam:  There were no vitals taken for this visit. No LMP for male patient. General:   Alert,  Well-developed, well-nourished, pleasant and cooperative in NAD Head:  Normocephalic and atraumatic. Eyes:  Sclera clear, no icterus.   Conjunctiva pink. Ears:  Normal auditory acuity. Nose:  No deformity, discharge, or lesions. Mouth:  No deformity or lesions,oropharynx pink & moist. Neck:  Supple; no masses or thyromegaly. Lungs:  Respirations even and unlabored.  Clear throughout to auscultation.   No wheezes, crackles, or rhonchi. No acute distress. Heart:  Regular rate and rhythm; no murmurs, clicks, rubs, or gallops. Abdomen:  Normal bowel sounds.  No bruits.  Soft, non-tender and non-distended without masses, hepatosplenomegaly or hernias noted.  No guarding or rebound tenderness.  Negative Carnett sign.   Rectal:  Deferred.  Msk:  Symmetrical without gross deformities.  Good, equal movement & strength bilaterally. Pulses:  Normal pulses noted. Extremities:  No clubbing or edema.  No cyanosis. Neurologic:  Alert and oriented x3;  grossly normal neurologically. Skin:  Intact without significant lesions or rashes.  No jaundice. Lymph Nodes:  No significant cervical adenopathy. Psych:  Alert and cooperative. Normal mood and affect.  Imaging Studies: Ct Renal Stone Study  Result Date: 04/16/2019 CLINICAL DATA:  Flank pain.  Right-sided flank pain. EXAM: CT ABDOMEN AND PELVIS WITHOUT CONTRAST TECHNIQUE: Multidetector CT imaging of the abdomen and pelvis was performed following the standard protocol without IV contrast. COMPARISON:  CT dated August 22, 2018 FINDINGS: Lower chest: The lung bases are clear. The heart size is normal. Hepatobiliary: The liver is normal. Normal gallbladder.There is no biliary ductal dilation. Pancreas: Normal contours without ductal dilatation. No peripancreatic fluid collection. Spleen: No splenic laceration or hematoma. Adrenals/Urinary Tract: --Adrenal glands: No  adrenal hemorrhage. --Right kidney/ureter: No hydronephrosis or perinephric hematoma. --Left kidney/ureter: No hydronephrosis or perinephric hematoma. --Urinary bladder: Unremarkable. Stomach/Bowel: --Stomach/Duodenum: No hiatal hernia or other gastric abnormality. Normal duodenal course and caliber. --Small bowel: There may be some wall thickening of the terminal ileum with adjacent mild inflammatory changes. --Colon: No focal abnormality. --Appendix: Normal. Vascular/Lymphatic: Normal course and caliber of the major abdominal vessels. --No retroperitoneal lymphadenopathy. --there are prominent lymph nodes in the right lower quadrant which is new from prior study. --No pelvic or inguinal lymphadenopathy. Reproductive: Unremarkable Other: No ascites or free air. The abdominal wall is normal. Musculoskeletal. No acute displaced fractures. IMPRESSION: 1. Findings suggestive of terminal ileitis or mesenteric adenitis. 2. Normal appendix in the right lower quadrant. 3. No radiopaque kidney stones. Electronically  Signed   By: Katherine Mantlehristopher  Green M.D.   On: 04/16/2019 00:21    Assessment and Plan:   Luke ChurchKenneth D Tingley Jr. is a 28 y.o. y/o male who comes in today with a history of longstanding GI problems and a CT scan showing terminal ileitis and possible mesenteric adenitis.  The patient did not have biopsies of his terminal ileum and he is father has Crohn's disease therefore the concern for him having inflammatory bowel disease is slightly higher.  The patient has been explained that since he has continued abdominal pain with an abnormal CT scan I would like to repeat the colonoscopy that was done 5 years ago. I have discussed risks & benefits which include, but are not limited to, bleeding, infection, perforation & drug reaction.  The patient agrees with this plan & written consent will be obtained.     Midge Miniumarren Amelia Macken, MD. Clementeen GrahamFACG    Note: This dictation was prepared with Dragon dictation along with smaller phrase  technology. Any transcriptional errors that result from this process are unintentional.

## 2019-05-15 NOTE — Progress Notes (Signed)
Gastroenterology Consultation  Referring Provider:     Derinda Late, MD Primary Care Physician:  Derinda Late, MD Primary Gastroenterologist:  Dr. Allen Norris     Reason for Consultation:     Abdominal pain        HPI:   Luke Conway. is a 28 y.o. y/o male referred for consultation & management of abdominal pain by Dr. Derinda Late, MD.  This patient comes in today after being seen in the ER at the end of July for right flank pain and abdominal pain.  The patient had reported that he saw some blood in his urine.  The patient was sent off for CT scan to look for renal stones.  CT scan showed:  IMPRESSION: 1. Findings suggestive of terminal ileitis or mesenteric adenitis. 2. Normal appendix in the right lower quadrant. 3. No radiopaque kidney stones.  The patient had been seen in the past by Dr. Rayann Heman in July 2015.  At that time the patient had reported diarrhea.  It appears from his note that the patient had a colonoscopy set up at that time and off campus site.   The patient's CBC at the time he visits to the ER was normal.  Back in March the patient had been seen with a teleconference by his PCP with report of active problems being diarrhea heartburn and abdominal pain.  The patient was started on Lexapro and Nexium and told that if this did not work he could pick up famotidine over-the-counter.  The patient reports that his abdominal pain is almost daily.  He denies any rectal bleeding or diarrhea at the present time.  The patient did have a colonoscopy with the terminal ileum reported to have been normal but no biopsies were taken of the terminal ileum but there were random biopsies throughout the colon that were normal.  Past Medical History:  Diagnosis Date  . Anxiety   . CVA (cerebral vascular accident) ([place])   . GERD (gastroesophageal reflux disease)   . Hyperlipidemia     Past Surgical History:  Procedure Laterality Date  . NO PAST SURGERIES      Prior to  Admission medications   Medication Sig Start Date End Date Taking? Authorizing Provider  amoxicillin (AMOXIL) 875 MG tablet Take 1 tablet (875 mg total) by mouth 2 (two) times daily. 11/15/18   Johnn Hai, PA-C  aspirin EC 81 MG tablet Take 81 mg by mouth daily.    [provider]  atorvastatin (LIPITOR) 80 MG tablet Take 80 mg by mouth daily.    [provider]  cyclobenzaprine (FLEXERIL) 5 MG tablet Take 1 tablet (5 mg total) by mouth 3 (three) times daily as needed for muscle spasms (do not drive on this med). 07/28/18   Schuyler Amor, MD  dicyclomine (BENTYL) 10 MG capsule Take 1 capsule (10 mg total) by mouth 4 (four) times daily -  before meals and at bedtime for 14 days. 10/17/18 10/31/18  Johnn Hai, PA-C  esomeprazole (NEXIUM) 20 MG capsule Take 20 mg by mouth daily at 12 noon.    [provider]  pantoprazole (PROTONIX) 20 MG tablet Take 1 tablet (20 mg total) by mouth daily. 11/28/18 11/28/19  Lavonia Drafts, MD  sucralfate (CARAFATE) 1 g tablet Take 1 tablet (1 g total) by mouth 4 (four) times daily for 20 days. 11/28/18 12/18/18  Lavonia Drafts, MD  traMADol (ULTRAM) 50 MG tablet Take 1 tablet (50 mg total) by mouth  every 6 (six) hours as needed. 04/16/19 04/15/20  Darci CurrentBrown, McDermitt N, MD    Family History  Problem Relation Age of Onset  . Diabetes Mother   . Heart attack Mother   . Crohn's disease Father   . Hypertension Father      Social History   Tobacco Use  . Smoking status: Current Every Day Smoker    Packs/day: 0.50    Last attempt to quit: 07/27/2017    Years since quitting: 1.8  . Smokeless tobacco: Former Engineer, waterUser  Substance Use Topics  . Alcohol use: Yes    Alcohol/week: 2.0 - 3.0 standard drinks    Types: 2 - 3 Cans of beer per week    Comment: twice per week  . Drug use: No    Allergies as of 05/15/2019  . (No Known Allergies)    Review of Systems:    All systems reviewed and negative except where noted in HPI.   Physical  Exam:  There were no vitals taken for this visit. No LMP for male patient. General:   Alert,  Well-developed, well-nourished, pleasant and cooperative in NAD Head:  Normocephalic and atraumatic. Eyes:  Sclera clear, no icterus.   Conjunctiva pink. Ears:  Normal auditory acuity. Nose:  No deformity, discharge, or lesions. Mouth:  No deformity or lesions,oropharynx pink & moist. Neck:  Supple; no masses or thyromegaly. Lungs:  Respirations even and unlabored.  Clear throughout to auscultation.   No wheezes, crackles, or rhonchi. No acute distress. Heart:  Regular rate and rhythm; no murmurs, clicks, rubs, or gallops. Abdomen:  Normal bowel sounds.  No bruits.  Soft, non-tender and non-distended without masses, hepatosplenomegaly or hernias noted.  No guarding or rebound tenderness.  Negative Carnett sign.   Rectal:  Deferred.  Msk:  Symmetrical without gross deformities.  Good, equal movement & strength bilaterally. Pulses:  Normal pulses noted. Extremities:  No clubbing or edema.  No cyanosis. Neurologic:  Alert and oriented x3;  grossly normal neurologically. Skin:  Intact without significant lesions or rashes.  No jaundice. Lymph Nodes:  No significant cervical adenopathy. Psych:  Alert and cooperative. Normal mood and affect.  Imaging Studies: Ct Renal Stone Study  Result Date: 04/16/2019 CLINICAL DATA:  Flank pain.  Right-sided flank pain. EXAM: CT ABDOMEN AND PELVIS WITHOUT CONTRAST TECHNIQUE: Multidetector CT imaging of the abdomen and pelvis was performed following the standard protocol without IV contrast. COMPARISON:  CT dated August 22, 2018 FINDINGS: Lower chest: The lung bases are clear. The heart size is normal. Hepatobiliary: The liver is normal. Normal gallbladder.There is no biliary ductal dilation. Pancreas: Normal contours without ductal dilatation. No peripancreatic fluid collection. Spleen: No splenic laceration or hematoma. Adrenals/Urinary Tract: --Adrenal glands: No  adrenal hemorrhage. --Right kidney/ureter: No hydronephrosis or perinephric hematoma. --Left kidney/ureter: No hydronephrosis or perinephric hematoma. --Urinary bladder: Unremarkable. Stomach/Bowel: --Stomach/Duodenum: No hiatal hernia or other gastric abnormality. Normal duodenal course and caliber. --Small bowel: There may be some wall thickening of the terminal ileum with adjacent mild inflammatory changes. --Colon: No focal abnormality. --Appendix: Normal. Vascular/Lymphatic: Normal course and caliber of the major abdominal vessels. --No retroperitoneal lymphadenopathy. --there are prominent lymph nodes in the right lower quadrant which is new from prior study. --No pelvic or inguinal lymphadenopathy. Reproductive: Unremarkable Other: No ascites or free air. The abdominal wall is normal. Musculoskeletal. No acute displaced fractures. IMPRESSION: 1. Findings suggestive of terminal ileitis or mesenteric adenitis. 2. Normal appendix in the right lower quadrant. 3. No radiopaque kidney stones. Electronically  Signed   By: Christopher  Green M.D.   On: 04/16/2019 00:21    Assessment and Plan:   Moussa D Waggoner Jr. is a 27 y.o. y/o male who comes in today with a history of longstanding GI problems and a CT scan showing terminal ileitis and possible mesenteric adenitis.  The patient did not have biopsies of his terminal ileum and he is father has Crohn's disease therefore the concern for him having inflammatory bowel disease is slightly higher.  The patient has been explained that since he has continued abdominal pain with an abnormal CT scan I would like to repeat the colonoscopy that was done 5 years ago. I have discussed risks & benefits which include, but are not limited to, bleeding, infection, perforation & drug reaction.  The patient agrees with this plan & written consent will be obtained.     Cash [place], MD. FACG    Note: This dictation was prepared with Dragon dictation along with smaller phrase  technology. Any transcriptional errors that result from this process are unintentional.   

## 2019-05-16 ENCOUNTER — Other Ambulatory Visit: Payer: Self-pay

## 2019-05-16 DIAGNOSIS — G8929 Other chronic pain: Secondary | ICD-10-CM

## 2019-05-16 DIAGNOSIS — R933 Abnormal findings on diagnostic imaging of other parts of digestive tract: Secondary | ICD-10-CM

## 2019-05-17 ENCOUNTER — Encounter: Payer: Self-pay | Admitting: Gastroenterology

## 2019-05-18 ENCOUNTER — Encounter: Payer: Self-pay | Admitting: *Deleted

## 2019-05-18 ENCOUNTER — Other Ambulatory Visit
Admission: RE | Admit: 2019-05-18 | Discharge: 2019-05-18 | Disposition: A | Discharge status: Home / Self Care | Payer: HRSA Program | Source: Ambulatory Visit | Attending: Gastroenterology | Admitting: Gastroenterology

## 2019-05-18 ENCOUNTER — Other Ambulatory Visit: Payer: Self-pay

## 2019-05-18 DIAGNOSIS — Z20828 Contact with and (suspected) exposure to other viral communicable diseases: Secondary | ICD-10-CM | POA: Insufficient documentation

## 2019-05-18 DIAGNOSIS — Z01812 Encounter for preprocedural laboratory examination: Secondary | ICD-10-CM | POA: Insufficient documentation

## 2019-05-18 LAB — SARS CORONAVIRUS 2 (TAT 6-24 HRS): SARS Coronavirus 2: NEGATIVE

## 2019-05-19 NOTE — Discharge Instructions (Signed)

## 2019-05-22 ENCOUNTER — Encounter
Admission: RE | Disposition: A | Discharge status: Home / Self Care | Payer: Self-pay | Source: Home / Self Care | Attending: Gastroenterology

## 2019-05-22 ENCOUNTER — Other Ambulatory Visit: Payer: Self-pay

## 2019-05-22 ENCOUNTER — Ambulatory Visit: Payer: Self-pay | Admitting: Anesthesiology

## 2019-05-22 ENCOUNTER — Ambulatory Visit
Admission: RE | Admit: 2019-05-22 | Discharge: 2019-05-22 | Disposition: A | Discharge status: Home / Self Care | Payer: Self-pay | Attending: Gastroenterology | Admitting: Gastroenterology

## 2019-05-22 DIAGNOSIS — K219 Gastro-esophageal reflux disease without esophagitis: Secondary | ICD-10-CM | POA: Insufficient documentation

## 2019-05-22 DIAGNOSIS — Z79899 Other long term (current) drug therapy: Secondary | ICD-10-CM | POA: Insufficient documentation

## 2019-05-22 DIAGNOSIS — R1031 Right lower quadrant pain: Secondary | ICD-10-CM | POA: Insufficient documentation

## 2019-05-22 DIAGNOSIS — E785 Hyperlipidemia, unspecified: Secondary | ICD-10-CM | POA: Insufficient documentation

## 2019-05-22 DIAGNOSIS — K621 Rectal polyp: Secondary | ICD-10-CM | POA: Insufficient documentation

## 2019-05-22 DIAGNOSIS — Z8673 Personal history of transient ischemic attack (TIA), and cerebral infarction without residual deficits: Secondary | ICD-10-CM | POA: Insufficient documentation

## 2019-05-22 DIAGNOSIS — Z7982 Long term (current) use of aspirin: Secondary | ICD-10-CM | POA: Insufficient documentation

## 2019-05-22 DIAGNOSIS — F17210 Nicotine dependence, cigarettes, uncomplicated: Secondary | ICD-10-CM | POA: Insufficient documentation

## 2019-05-22 DIAGNOSIS — F419 Anxiety disorder, unspecified: Secondary | ICD-10-CM | POA: Insufficient documentation

## 2019-05-22 DIAGNOSIS — R933 Abnormal findings on diagnostic imaging of other parts of digestive tract: Secondary | ICD-10-CM | POA: Insufficient documentation

## 2019-05-22 HISTORY — PX: POLYPECTOMY: SHX5525

## 2019-05-22 HISTORY — PX: BIOPSY: SHX5522

## 2019-05-22 HISTORY — PX: COLONOSCOPY WITH PROPOFOL: SHX5780

## 2019-05-22 SURGERY — COLONOSCOPY WITH PROPOFOL
Anesthesia: General | Site: Rectum

## 2019-05-22 MED ORDER — LIDOCAINE HCL (CARDIAC) PF 100 MG/5ML IV SOSY
PREFILLED_SYRINGE | INTRAVENOUS | Status: DC | PRN
  Administered 2019-05-22: 50 mg via INTRAVENOUS

## 2019-05-22 MED ORDER — ACETAMINOPHEN 10 MG/ML IV SOLN: 1000.0000 mg | Freq: Once | INTRAVENOUS | Status: DC | PRN

## 2019-05-22 MED ORDER — LACTATED RINGERS IV SOLN
100.0000 mL/h | INTRAVENOUS | Status: DC
  Administered 2019-05-22: 100 mL/h via INTRAVENOUS

## 2019-05-22 MED ORDER — STERILE WATER FOR IRRIGATION IR SOLN
Status: DC | PRN
  Administered 2019-05-22: 100 mL

## 2019-05-22 MED ORDER — ONDANSETRON HCL 4 MG/2ML IJ SOLN: 4.0000 mg | Freq: Once | INTRAMUSCULAR | Status: DC | PRN

## 2019-05-22 MED ORDER — PROPOFOL 10 MG/ML IV BOLUS
INTRAVENOUS | Status: DC | PRN
  Administered 2019-05-22 (×2): 100 mg via INTRAVENOUS

## 2019-05-22 SURGICAL SUPPLY — 24 items
CANISTER SUCT 1200ML W/VALVE (MISCELLANEOUS) ×4 IMPLANT
CLIP HMST 235XBRD CATH ROT (MISCELLANEOUS) IMPLANT
CLIP RESOLUTION 360 11X235 (MISCELLANEOUS)
ELECT REM PT RETURN 9FT ADLT (ELECTROSURGICAL)
ELECTRODE REM PT RTRN 9FT ADLT (ELECTROSURGICAL) IMPLANT
FCP ESCP3.2XJMB 240X2.8X (MISCELLANEOUS) ×2
FORCEPS BIOP RAD 4 LRG CAP 4 (CUTTING FORCEPS) IMPLANT
FORCEPS BIOP RJ4 240 W/NDL (MISCELLANEOUS) ×2
FORCEPS ESCP3.2XJMB 240X2.8X (MISCELLANEOUS) IMPLANT
GOWN CVR UNV OPN BCK APRN NK (MISCELLANEOUS) ×4 IMPLANT
GOWN ISOL THUMB LOOP REG UNIV (MISCELLANEOUS) ×4
INJECTOR VARIJECT VIN23 (MISCELLANEOUS) IMPLANT
KIT DEFENDO VALVE AND CONN (KITS) IMPLANT
KIT ENDO PROCEDURE OLY (KITS) ×4 IMPLANT
MARKER SPOT ENDO TATTOO 5ML (MISCELLANEOUS) IMPLANT
PROBE APC STR FIRE (PROBE) IMPLANT
RETRIEVER NET ROTH 2.5X230 LF (MISCELLANEOUS) IMPLANT
SNARE SHORT THROW 13M SML OVAL (MISCELLANEOUS) IMPLANT
SNARE SHORT THROW 30M LRG OVAL (MISCELLANEOUS) IMPLANT
SNARE SNG USE RND 15MM (INSTRUMENTS) IMPLANT
SPOT EX ENDOSCOPIC TATTOO (MISCELLANEOUS)
TRAP ETRAP POLY (MISCELLANEOUS) IMPLANT
VARIJECT INJECTOR VIN23 (MISCELLANEOUS)
WATER STERILE IRR 250ML POUR (IV SOLUTION) ×4 IMPLANT

## 2019-05-22 NOTE — Anesthesia Postprocedure Evaluation (Signed)
Anesthesia Post Note  Patient: Elzia Hott.  Procedure(s) Performed: COLONOSCOPY WITH PROPOFOL (N/A Rectum) BIOPSY (N/A Rectum) POLYPECTOMY (Rectum)  Patient location during evaluation: PACU Anesthesia Type: General Level of consciousness: awake and alert Pain management: pain level controlled Vital Signs Assessment: post-procedure vital signs reviewed and stable Respiratory status: spontaneous breathing, nonlabored ventilation, respiratory function stable and patient connected to nasal cannula oxygen Cardiovascular status: blood pressure returned to baseline and stable Postop Assessment: no apparent nausea or vomiting Anesthetic complications: no    Kden Wagster A  Kinslea Frances

## 2019-05-22 NOTE — Transfer of Care (Signed)
Immediate Anesthesia Transfer of Care Note  Patient: Luke Conway.  Procedure(s) Performed: COLONOSCOPY WITH PROPOFOL (N/A Rectum) BIOPSY (N/A Rectum) POLYPECTOMY (Rectum)  Patient Location: PACU  Anesthesia Type: General  Level of Consciousness: awake, alert  and patient cooperative  Airway and Oxygen Therapy: Patient Spontanous Breathing and Patient connected to supplemental oxygen  Post-op Assessment: Post-op Vital signs reviewed, Patient's Cardiovascular Status Stable, Respiratory Function Stable, Patent Airway and No signs of Nausea or vomiting  Post-op Vital Signs: Reviewed and stable  Complications: No apparent anesthesia complications

## 2019-05-22 NOTE — Anesthesia Preprocedure Evaluation (Addendum)
Anesthesia Evaluation  Patient identified by MRN, date of birth, ID band Patient awake    Reviewed: Allergy & Precautions, NPO status , Patient's Chart, lab work & pertinent test results  History of Anesthesia Complications Negative for: history of anesthetic complications  Airway Mallampati: II  TM Distance: >3 FB Neck ROM: Full    Dental  (+)    Pulmonary Current Smoker and Patient abstained from smoking.,    breath sounds clear to auscultation       Cardiovascular (-) angina(-) DOE  Rhythm:Regular Rate:Normal     Neuro/Psych PSYCHIATRIC DISORDERS Anxiety CVA (H/o stress-induced episode w/ hemiparesis/numbness)    GI/Hepatic GERD  Medicated and Controlled,  Endo/Other    Renal/GU      Musculoskeletal   Abdominal   Peds  Hematology   Anesthesia Other Findings   Reproductive/Obstetrics                          Anesthesia Physical Anesthesia Plan  ASA: II  Anesthesia Plan: General   Post-op Pain Management:    Induction: Intravenous  PONV Risk Score and Plan: TIVA  Airway Management Planned: Natural Airway  Additional Equipment:   Intra-op Plan:   Post-operative Plan:   Informed Consent: I have reviewed the patients History and Physical, chart, labs and discussed the procedure including the risks, benefits and alternatives for the proposed anesthesia with the patient or authorized representative who has indicated his/her understanding and acceptance.       Plan Discussed with: CRNA and Anesthesiologist  Anesthesia Plan Comments:         Anesthesia Quick Evaluation   Active Ambulatory Problems    Diagnosis Date Noted  . Left-sided weakness 08/19/2017  . GERD (gastroesophageal reflux disease) 08/19/2017  . History of stroke 08/19/2017  . Abdominal pain 04/22/2014  . Acute ischemic stroke (HCC) 08/02/2017  . Anxiety disorder, unspecified 05/15/2019  . Diarrhea  04/22/2014  . Tobacco use disorder 08/03/2017   Resolved Ambulatory Problems    Diagnosis Date Noted  . No Resolved Ambulatory Problems   Past Medical History:  Diagnosis Date  . Anxiety   . CVA (cerebral vascular accident) ([facility])   . Hyperlipidemia     CBC    Component Value Date/Time   WBC 9.8 04/15/2019 2102   RBC 5.30 04/15/2019 2102   HGB 14.8 04/15/2019 2102   HGB 15.9 03/30/2014 1407   HCT 42.5 04/15/2019 2102   HCT 46.5 03/30/2014 1407   PLT 219 04/15/2019 2102   PLT 222 03/30/2014 1407   MCV 80.2 04/15/2019 2102   MCV 83 03/30/2014 1407   MCH 27.9 04/15/2019 2102   MCHC 34.8 04/15/2019 2102   RDW 12.7 04/15/2019 2102   RDW 13.0 03/30/2014 1407   LYMPHSABS 1.2 11/15/2018 0816   LYMPHSABS 2.1 03/30/2014 1407   MONOABS 1.1 (H) 11/15/2018 0816   MONOABS 0.5 03/30/2014 1407   EOSABS 0.1 11/15/2018 0816   EOSABS 0.1 03/30/2014 1407   BASOSABS 0.1 11/15/2018 0816   BASOSABS 0.1 03/30/2014 1407    CMP     Component Value Date/Time   NA 142 04/15/2019 2102   NA 143 03/30/2014 1407   K 3.5 04/15/2019 2102   K 3.9 03/30/2014 1407   CL 112 (H) 04/15/2019 2102   CL 110 (H) 03/30/2014 1407   CO2 23 04/15/2019 2102   CO2 25 03/30/2014 1407   GLUCOSE 123 (H) 04/15/2019 2102   GLUCOSE 117 (H) 03/30/2014 1407  BUN 15 04/15/2019 2102   BUN 15 03/30/2014 1407   CREATININE 0.68 04/15/2019 2102   CREATININE 0.70 03/30/2014 1407   CALCIUM 8.8 (L) 04/15/2019 2102   CALCIUM 8.8 03/30/2014 1407   PROT 7.2 11/28/2018 1351   PROT 7.3 03/30/2014 1407   ALBUMIN 4.6 11/28/2018 1351   ALBUMIN 4.2 03/30/2014 1407   AST 22 11/28/2018 1351   AST 30 03/30/2014 1407   ALT 36 11/28/2018 1351   ALT 35 03/30/2014 1407   ALKPHOS 62 11/28/2018 1351   ALKPHOS 87 03/30/2014 1407   BILITOT 1.1 11/28/2018 1351   BILITOT 0.5 03/30/2014 1407   GFRNONAA >60 04/15/2019 2102   GFRNONAA >60 03/30/2014 1407   GFRAA >60 04/15/2019 2102   GFRAA >60 03/30/2014 1407    COAGS     Component Value Date/Time   INR 0.94 08/19/2017 2135    I have seen and consented the patient, Luke Conway.. I have answered all of his questions regarding anesthesia. he is appropriately NPO.   Josephina Shih, MD Anesthesia

## 2019-05-22 NOTE — Interval H&P Note (Signed)
History and Physical Interval Note:  05/22/2019 9:54 AM  Luke Conway.  has presented today for surgery, with the diagnosis of Abdominal pain RLQR10.31 Abnormal CT scan of the small bowel R93.3.  The various methods of treatment have been discussed with the patient and family. After consideration of risks, benefits and other options for treatment, the patient has consented to  Procedure(s): COLONOSCOPY WITH PROPOFOL (N/A) as a surgical intervention.  The patient's history has been reviewed, patient examined, no change in status, stable for surgery.  I have reviewed the patient's chart and labs.  Questions were answered to the patient's satisfaction.     Sricharan Lacomb [employer]

## 2019-05-22 NOTE — Op Note (Signed)
[facility] Patient Name: Luke Conway Procedure Date: 05/22/2019 10:42 AM MRN: [id number] Account #: [id number] Date of Birth: 08-15-1991 Admit Type: Outpatient Age: 28 Room: [facility] OR ROOM 01 Gender: Male Note Status: Finalized Procedure:            Colonoscopy Indications:          Abdominal pain in the right lower quadrant, Abnormal CT                        of the GI tract Providers:            Lucilla Lame MD, MD Referring MD:         Caprice Renshaw MD (Referring MD) Medicines:            Propofol per Anesthesia Complications:        No immediate complications. Procedure:            Pre-Anesthesia Assessment:                       - Prior to the procedure, a History and Physical was                        performed, and patient medications and allergies were                        reviewed. The patient's tolerance of previous                        anesthesia was also reviewed. The risks and benefits of                        the procedure and the sedation options and risks were                        discussed with the patient. All questions were                        answered, and informed consent was obtained. Prior                        Anticoagulants: The patient has taken no previous                        anticoagulant or antiplatelet agents. ASA Grade                        Assessment: II - A patient with mild systemic disease.                        After reviewing the risks and benefits, the patient was                        deemed in satisfactory condition to undergo the                        procedure.                       After obtaining informed consent, the colonoscope was  passed under direct vision. Throughout the procedure,                        the patient's blood pressure, pulse, and oxygen                        saturations were monitored continuously. The was                         introduced through the anus and advanced to the the                        terminal ileum. The colonoscopy was performed without                        difficulty. The patient tolerated the procedure well.                        The quality of the bowel preparation was excellent. Findings:      The perianal and digital rectal examinations were normal.      A localized area of mucosa in the terminal ileum was mildly       erythematous. This was biopsied with a cold forceps for histology.      Two sessile polyps were found in the rectum. The polyps were 1 to 2 mm       in size. These polyps were removed with a cold biopsy forceps. Resection       and retrieval were complete.      Random biopsies were obtained with cold forceps for histology randomly       in the entire colon. Impression:           - Erythematous mucosa in the terminal ileum. Biopsied.                       - Two 1 to 2 mm polyps in the rectum, removed with a                        cold biopsy forceps. Resected and retrieved.                       - Random biopsies were obtained in the entire colon. Recommendation:       - Discharge patient to home.                       - Resume previous diet.                       - Continue present medications.                       - Await pathology results. Procedure Code(s):    --- Professional ---                       [phone], Colonoscopy, flexible; with biopsy, single or                        multiple Diagnosis Code(s):    --- Professional ---  R93.3, Abnormal findings on diagnostic imaging of other                        parts of digestive tract                       R10.31, Right lower quadrant pain                       K62.1, Rectal polyp CPT copyright 2019 [employer] Association. All rights reserved. The codes documented in this report are preliminary and upon coder review may  be revised to meet current compliance requirements. Midge Minium MD,  MD 05/22/2019 11:05:56 AM This report has been signed electronically. Number of Addenda: 0 Note Initiated On: 05/22/2019 10:42 AM Scope Withdrawal Time: 0 hours 7 minutes 5 seconds  Total Procedure Duration: 0 hours 9 minutes 22 seconds  Estimated Blood Loss: Estimated blood loss: none.      [facility]

## 2019-05-22 NOTE — Anesthesia Procedure Notes (Signed)
Procedure Name: General with mask airway Performed by: Jayvier Burgher M, CRNA Pre-anesthesia Checklist: Patient identified, Emergency Drugs available, Suction available, Patient being monitored and Timeout performed Patient Re-evaluated:Patient Re-evaluated prior to induction Oxygen Delivery Method: Nasal cannula       

## 2019-05-25 ENCOUNTER — Encounter: Payer: Self-pay | Admitting: Gastroenterology

## 2019-05-31 ENCOUNTER — Encounter: Payer: Self-pay | Admitting: Gastroenterology

## 2019-06-19 ENCOUNTER — Encounter: Payer: Self-pay | Admitting: Emergency Medicine

## 2019-06-19 ENCOUNTER — Ambulatory Visit
Admission: EM | Admit: 2019-06-19 | Discharge: 2019-06-19 | Disposition: A | Discharge status: Home / Self Care | Payer: Self-pay | Attending: Internal Medicine | Admitting: Internal Medicine

## 2019-06-19 ENCOUNTER — Other Ambulatory Visit: Payer: Self-pay

## 2019-06-19 ENCOUNTER — Emergency Department
Admission: EM | Admit: 2019-06-19 | Discharge: 2019-06-19 | Disposition: A | Discharge status: Home / Self Care | Payer: Self-pay | Attending: Emergency Medicine | Admitting: Emergency Medicine

## 2019-06-19 DIAGNOSIS — R1031 Right lower quadrant pain: Secondary | ICD-10-CM

## 2019-06-19 DIAGNOSIS — F17210 Nicotine dependence, cigarettes, uncomplicated: Secondary | ICD-10-CM | POA: Insufficient documentation

## 2019-06-19 DIAGNOSIS — R1030 Lower abdominal pain, unspecified: Secondary | ICD-10-CM

## 2019-06-19 DIAGNOSIS — R1032 Left lower quadrant pain: Secondary | ICD-10-CM | POA: Insufficient documentation

## 2019-06-19 DIAGNOSIS — Z8673 Personal history of transient ischemic attack (TIA), and cerebral infarction without residual deficits: Secondary | ICD-10-CM | POA: Insufficient documentation

## 2019-06-19 DIAGNOSIS — Z79899 Other long term (current) drug therapy: Secondary | ICD-10-CM | POA: Insufficient documentation

## 2019-06-19 LAB — URINALYSIS, COMPLETE (UACMP) WITH MICROSCOPIC
Bacteria, UA: NONE SEEN
Bilirubin Urine: NEGATIVE
Bilirubin Urine: NEGATIVE
Glucose, UA: NEGATIVE mg/dL
Glucose, UA: NEGATIVE mg/dL
Hgb urine dipstick: NEGATIVE
Hgb urine dipstick: NEGATIVE
Ketones, ur: NEGATIVE mg/dL
Ketones, ur: NEGATIVE mg/dL
Leukocytes,Ua: NEGATIVE
Leukocytes,Ua: NEGATIVE
Nitrite: NEGATIVE
Nitrite: NEGATIVE
Protein, ur: NEGATIVE mg/dL
Protein, ur: NEGATIVE mg/dL
Specific Gravity, Urine: 1.025 (ref 1.005–1.030)
Specific Gravity, Urine: 1.027 (ref 1.005–1.030)
Squamous Epithelial / HPF: NONE SEEN (ref 0–5)
pH: 7 (ref 5.0–8.0)
pH: 5 (ref 5.0–8.0)

## 2019-06-19 LAB — CBC
HCT: 45.9 % (ref 39.0–52.0)
Hemoglobin: 15.8 g/dL (ref 13.0–17.0)
MCH: 27.6 pg (ref 26.0–34.0)
MCHC: 34.4 g/dL (ref 30.0–36.0)
MCV: 80.1 fL (ref 80.0–100.0)
Platelets: 231 10*3/uL (ref 150–400)
RBC: 5.73 MIL/uL (ref 4.22–5.81)
RDW: 13.1 % (ref 11.5–15.5)
WBC: 8.1 10*3/uL (ref 4.0–10.5)
nRBC: 0 % (ref 0.0–0.2)

## 2019-06-19 LAB — COMPREHENSIVE METABOLIC PANEL
ALT: 89 U/L — ABNORMAL HIGH (ref 0–44)
AST: 42 U/L — ABNORMAL HIGH (ref 15–41)
Albumin: 4.2 g/dL (ref 3.5–5.0)
Alkaline Phosphatase: 72 U/L (ref 38–126)
Anion gap: 9 (ref 5–15)
BUN: 15 mg/dL (ref 6–20)
CO2: 23 mmol/L (ref 22–32)
Calcium: 9.2 mg/dL (ref 8.9–10.3)
Chloride: 108 mmol/L (ref 98–111)
Creatinine, Ser: 0.66 mg/dL (ref 0.61–1.24)
GFR calc Af Amer: >60 mL/min (ref >60)
GFR calc non Af Amer: >60 mL/min (ref >60)
Glucose, Bld: 98 mg/dL (ref 70–99)
Potassium: 3.9 mmol/L (ref 3.5–5.1)
Sodium: 140 mmol/L (ref 135–145)
Total Bilirubin: 0.7 mg/dL (ref 0.3–1.2)
Total Protein: 7.2 g/dL (ref 6.5–8.1)

## 2019-06-19 LAB — LIPASE, BLOOD: Lipase: 21 U/L (ref 11–51)

## 2019-06-19 MED ORDER — DICYCLOMINE HCL 10 MG/ML IM SOLN
20.0000 mg | Freq: Once | INTRAMUSCULAR | Status: AC
  Administered 2019-06-19: 20 mg via INTRAMUSCULAR
  Filled 2019-06-19 (×2): qty 2

## 2019-06-19 MED ORDER — DICYCLOMINE HCL 20 MG PO TABS: 20.0000 mg | ORAL_TABLET | Freq: Three times a day (TID) | ORAL | 0 refills | Status: DC | PRN

## 2019-06-19 NOTE — ED Triage Notes (Signed)
Pt to ED c/o right side abd pain for several days getting more frequent.  States seen for same pain before and had colonoscopy done 4 weeks ago waiting for results.  Nausea but no vomiting or diarrhea.

## 2019-06-19 NOTE — ED Triage Notes (Signed)
Pt c/o RLQ pain. Started about 2 months ago. He had a colonoscopy about 4 weeks ago. He states that the pain is worse this morning and made him feel like he was going to pass out. He states he also has nausea. Denies vomiting, diarrhea or fever.

## 2019-06-19 NOTE — ED Notes (Signed)
IV removed from left hand.  Catheter intact and bleeding controlled, gauze placed.

## 2019-06-19 NOTE — ED Provider Notes (Signed)
[facility]    CSN: [id number] Arrival date & time: 06/19/19  1129      History   Chief Complaint Chief Complaint  Patient presents with  . Abdominal Pain    HPI Luke Conway. is a 28 y.o. male with recurrent lower abdominal pain comes to urgent care with right lower quadrant abdominal pain of 1 day duration.  Patient has had these symptoms over the past couple of months.  He had colonoscopy done with terminal ileal biopsies.  Colonoscopy was unremarkable except for hyperplastic polyps.  Biopsies from the terminal ileum showed prominent peyer's patches.  Patient denies any fever or chills.  Pain is of moderate severity.  No known relieving factors.  Pain is aggravated by palpation.  Patient denies any nausea, vomiting or diarrhea.  No trauma to the abdomen.  No radiation of the abdominal pain   HPI  Past Medical History:  Diagnosis Date  . Anxiety   . CVA (cerebral vascular accident) [facility])    pt reports was "stress/anxiety event", not stroke  . GERD (gastroesophageal reflux disease)   . Hyperlipidemia     Patient Active Problem List   Diagnosis Date Noted  . Abnormal computed tomography of small intestine   . RLQ abdominal pain   . Rectal polyp   . Anxiety disorder, unspecified 05/15/2019  . Left-sided weakness 08/19/2017  . GERD (gastroesophageal reflux disease) 08/19/2017  . History of stroke 08/19/2017  . Tobacco use disorder 08/03/2017  . Acute ischemic stroke ([place]) 08/02/2017  . Abdominal pain 04/22/2014  . Diarrhea 04/22/2014    Past Surgical History:  Procedure Laterality Date  . BIOPSY N/A 05/22/2019   Procedure: BIOPSY;  Surgeon: Lucilla Lame, MD;  Location: [place];  Service: Endoscopy;  Laterality: N/A;  . COLONOSCOPY  2015  . COLONOSCOPY WITH PROPOFOL N/A 05/22/2019   Procedure: COLONOSCOPY WITH PROPOFOL;  Surgeon: Lucilla Lame, MD;  Location: [place];  Service: Endoscopy;  Laterality: N/A;  . POLYPECTOMY   05/22/2019   Procedure: POLYPECTOMY;  Surgeon: Lucilla Lame, MD;  Location: [place];  Service: Endoscopy;;       Home Medications    Prior to Admission medications   Medication Sig Start Date End Date Taking? Authorizing Provider  cyclobenzaprine (FLEXERIL) 5 MG tablet Take 1 tablet (5 mg total) by mouth 3 (three) times daily as needed for muscle spasms (do not drive on this med). 07/28/18  Yes Schuyler Amor, MD  escitalopram (LEXAPRO) 10 MG tablet Take by mouth. 12/13/18  Yes [provider]  esomeprazole (NEXIUM) 20 MG capsule Take 20 mg by mouth daily at 12 noon.   Yes [provider]  dicyclomine (BENTYL) 20 MG tablet Take 1 tablet (20 mg total) by mouth 3 (three) times daily as needed for spasms. 06/19/19 06/18/20  Lavonia Drafts, MD    Family History Family History  Problem Relation Age of Onset  . Diabetes Mother   . Heart attack Mother   . Crohn's disease Father   . Hypertension Father     Social History Social History   Tobacco Use  . Smoking status: Current Every Day Smoker    Packs/day: 0.50    Years: 10.00    Pack years: 5.00    Last attempt to quit: 07/27/2017    Years since quitting: 1.8  . Smokeless tobacco: Current User    Types: Snuff, Chew  . Tobacco comment: since age 30  Substance Use Topics  . Alcohol  use: Yes    Alcohol/week: 6.0 standard drinks    Types: 6 Cans of beer per week    Comment: 3-4 beers a week  . Drug use: No     Allergies   Patient has no known allergies.   Review of Systems Review of Systems  Constitutional: Negative.  Negative for chills, fatigue and fever.  HENT: Negative.   Respiratory: Negative.   Cardiovascular: Negative.   Gastrointestinal: Positive for abdominal pain. Negative for abdominal distention, diarrhea and vomiting.  Genitourinary: Negative.   Musculoskeletal: Negative.   Neurological: Negative.  Negative for dizziness, tremors, weakness and headaches.     Physical Exam  Triage Vital Signs ED Triage Vitals  Enc Vitals Group     BP 06/19/19 1149 115/74     Pulse Rate 06/19/19 1149 75     Resp 06/19/19 1149 18     Temp 06/19/19 1149 98 F (36.7 C)     Temp Source 06/19/19 1149 Oral     SpO2 06/19/19 1149 100 %     Weight 06/19/19 1145 163 lb (73.9 kg)     Height 06/19/19 1145 5\' 7"  (1.702 m)     Head Circumference --      Peak Flow --      Pain Score --      Pain Loc --      Pain Edu? --      Excl. in GC? --    No data found.  Updated Vital Signs BP 115/74 (BP Location: Left Arm)   Pulse 75   Temp 98 F (36.7 C) (Oral)   Resp 18   Ht 5\' 7"  (1.702 m)   Wt 73.9 kg   SpO2 100%   BMI 25.53 kg/m   Visual Acuity Right Eye Distance:   Left Eye Distance:   Bilateral Distance:    Right Eye Near:   Left Eye Near:    Bilateral Near:     Physical Exam Constitutional:      General: He is in acute distress.     Appearance: He is not ill-appearing.  Cardiovascular:     Rate and Rhythm: Normal rate and regular rhythm.     Heart sounds: Normal heart sounds.  Pulmonary:     Effort: Pulmonary effort is normal.     Breath sounds: Normal breath sounds.  Abdominal:     General: Bowel sounds are normal.     Tenderness: There is abdominal tenderness in the right lower quadrant. There is guarding. There is no rebound. Positive signs include McBurney's sign. Negative signs include Murphy's sign.     Hernia: No hernia is present.  Skin:    Capillary Refill: Capillary refill takes less than 2 seconds.  Neurological:     General: No focal deficit present.     Mental Status: He is alert.      UC Treatments / Results  Labs (all labs ordered are listed, but only abnormal results are displayed) Labs Reviewed  URINALYSIS, COMPLETE (UACMP) WITH MICROSCOPIC - Abnormal; Notable for the following components:      Result Value   Bacteria, UA RARE (*)    All other components within normal limits    EKG   Radiology No results found.  Procedures  Procedures (including critical care time)  Medications Ordered in UC Medications - No data to display  Initial Impression / Assessment and Plan / UC Course  I have reviewed the triage vital signs and the nursing notes.  Pertinent labs &  imaging results that were available during my care of the patient were reviewed by me and considered in my medical decision making (see chart for details).     1.  Right lower quadrant abdominal pain: Given the patient's history and persistence of the abdominal pain.  Patient is advised to go to the emergency department for further evaluation.  Right lower quadrant abdominal pain could either be mesenteric adenitis or appendicitis.  Emergency department is the best place for the patient to be evaluated. Final Clinical Impressions(s) / UC Diagnoses   Final diagnoses:  Right lower quadrant abdominal pain   Discharge Instructions   None    ED Prescriptions    None     PDMP not reviewed this encounter.   Merrilee Jansky, MD 06/19/19 [phone]

## 2019-06-19 NOTE — ED Provider Notes (Signed)
[facility] Emergency Department Provider Note   ____________________________________________    I have reviewed the triage vital signs and the nursing notes.   HISTORY  Chief Complaint Abdominal Pain     HPI Luke Conway. is a 28 y.o. male who presents with complaints of lower abdominal pain.  Patient describes somewhat chronic lower abdominal pain for approximately the last 5 years.  He did have a colonoscopy performed 4 weeks ago after a CT scan showed some possible ileitis.  I reviewed the colonoscopy results which are quite reassuring.  Patient reports last night his pain was worse than typical.  He reports nothing seems to help except for muscle relaxers, and narcotics.  No fevers or chills.  No nausea or vomiting.  Does see gastroenterology for this.  Past Medical History:  Diagnosis Date  . Anxiety   . CVA (cerebral vascular accident) [facility])    pt reports was "stress/anxiety event", not stroke  . GERD (gastroesophageal reflux disease)   . Hyperlipidemia     Patient Active Problem List   Diagnosis Date Noted  . Abnormal computed tomography of small intestine   . RLQ abdominal pain   . Rectal polyp   . Anxiety disorder, unspecified 05/15/2019  . Left-sided weakness 08/19/2017  . GERD (gastroesophageal reflux disease) 08/19/2017  . History of stroke 08/19/2017  . Tobacco use disorder 08/03/2017  . Acute ischemic stroke ([place]) 08/02/2017  . Abdominal pain 04/22/2014  . Diarrhea 04/22/2014    Past Surgical History:  Procedure Laterality Date  . BIOPSY N/A 05/22/2019   Procedure: BIOPSY;  Surgeon: Lucilla Lame, MD;  Location: [place];  Service: Endoscopy;  Laterality: N/A;  . COLONOSCOPY  2015  . COLONOSCOPY WITH PROPOFOL N/A 05/22/2019   Procedure: COLONOSCOPY WITH PROPOFOL;  Surgeon: Lucilla Lame, MD;  Location: [place];  Service: Endoscopy;  Laterality: N/A;  . POLYPECTOMY  05/22/2019   Procedure:  POLYPECTOMY;  Surgeon: Lucilla Lame, MD;  Location: [place];  Service: Endoscopy;;    Prior to Admission medications   Medication Sig Start Date End Date Taking? Authorizing Provider  cyclobenzaprine (FLEXERIL) 5 MG tablet Take 1 tablet (5 mg total) by mouth 3 (three) times daily as needed for muscle spasms (do not drive on this med). 07/28/18   Schuyler Amor, MD  dicyclomine (BENTYL) 20 MG tablet Take 1 tablet (20 mg total) by mouth 3 (three) times daily as needed for spasms. 06/19/19 06/18/20  Lavonia Drafts, MD  escitalopram (LEXAPRO) 10 MG tablet Take by mouth. 12/13/18   [provider]  esomeprazole (NEXIUM) 20 MG capsule Take 20 mg by mouth daily at 12 noon.    [provider]     Allergies Patient has no known allergies.  Family History  Problem Relation Age of Onset  . Diabetes Mother   . Heart attack Mother   . Crohn's disease Father   . Hypertension Father     Social History Social History   Tobacco Use  . Smoking status: Current Every Day Smoker    Packs/day: 0.50    Years: 10.00    Pack years: 5.00    Last attempt to quit: 07/27/2017    Years since quitting: 1.8  . Smokeless tobacco: Current User    Types: Snuff, Chew  . Tobacco comment: since age 10  Substance Use Topics  . Alcohol use: Yes    Alcohol/week: 6.0 standard drinks    Types: 6 Cans of beer  per week    Comment: 3-4 beers a week  . Drug use: No    Review of Systems  Constitutional: No fever/chills Eyes: No visual changes.  ENT: No sore throat. Cardiovascular: Denies chest pain. Respiratory: Denies shortness of breath. Gastrointestinal: As above Genitourinary: Negative for dysuria. Musculoskeletal: Negative for back pain. Skin: Negative for rash. Neurological: Negative for headaches   ____________________________________________   PHYSICAL EXAM:  VITAL SIGNS: ED Triage Vitals  Enc Vitals Group     BP 06/19/19 1408 109/70     Pulse Rate 06/19/19 1408  73     Resp 06/19/19 1408 14     Temp 06/19/19 1408 98.3 F (36.8 C)     Temp Source 06/19/19 1408 Oral     SpO2 06/19/19 1408 98 %     Weight 06/19/19 1409 72.6 kg (160 lb)     Height 06/19/19 1409 1.702 m (5\' 7" )     Head Circumference --      Peak Flow --      Pain Score 06/19/19 1409 9     Pain Loc --      Pain Edu? --      Excl. in GC? --     Constitutional: Alert and oriented.  Eyes: Conjunctivae are normal.    Mouth/Throat: Mucous membranes are moist.    Cardiovascular: Normal rate, regular rhythm. Grossly normal heart sounds.  Good peripheral circulation. Respiratory: Normal respiratory effort.  No retractions. Lungs CTAB. Gastrointestinal: Soft, mild tenderness palpation left lower quadrant, right lower quadrant. No distention.  No CVA tenderness.  Reassuring exam  Musculoskeletal: .  Warm and well perfused Neurologic:  Normal speech and language. No gross focal neurologic deficits are appreciated.  Skin:  Skin is warm, dry and intact. No rash noted. Psychiatric: Mood and affect are normal. Speech and behavior are normal.  ____________________________________________   LABS (all labs ordered are listed, but only abnormal results are displayed)  Labs Reviewed  COMPREHENSIVE METABOLIC PANEL - Abnormal; Notable for the following components:      Result Value   AST 42 (*)    ALT 89 (*)    All other components within normal limits  URINALYSIS, COMPLETE (UACMP) WITH MICROSCOPIC - Abnormal; Notable for the following components:   Color, Urine AMBER (*)    APPearance HAZY (*)    All other components within normal limits  LIPASE, BLOOD  CBC   ____________________________________________  EKG   ____________________________________________  RADIOLOGY  None ____________________________________________   PROCEDURES  Procedure(s) performed: No  Procedures   Critical Care performed: No ____________________________________________   INITIAL IMPRESSION  / ASSESSMENT AND PLAN / ED COURSE  Pertinent labs & imaging results that were available during my care of the patient were reviewed by me and considered in my medical decision making (see chart for details).  Patient with pain consistent with his chronic lower abdominal pain, reviewed medical records from previous visits, CT scan performed just prior to colonoscopy reassuring, normal appendix.  Patient has left lower quadrant and right lower quadrant mild tenderness  Lab work is reassuring, very minimal elevations in AST ALT is nonspecific and will require follow-up, will try IM Bentyl   ----------------------------------------- 3:52 PM on 06/19/2019 -----------------------------------------  Patient with significant improvement after IM Bentyl, will prescribe p.o. Bentyl, have recommended follow-up with GI.  We did discuss return precautions and he agrees with no imaging at this time    ____________________________________________   FINAL CLINICAL IMPRESSION(S) / ED DIAGNOSES  Final diagnoses:  Lower  abdominal pain        Note:  This document was prepared using Dragon voice recognition software and may include unintentional dictation errors.   Jene Every, MD 06/19/19 [phone]

## 2019-08-22 ENCOUNTER — Encounter: Payer: Self-pay | Admitting: Emergency Medicine

## 2019-08-22 ENCOUNTER — Other Ambulatory Visit: Payer: Self-pay

## 2019-08-22 DIAGNOSIS — R109 Unspecified abdominal pain: Secondary | ICD-10-CM | POA: Insufficient documentation

## 2019-08-22 DIAGNOSIS — Z5321 Procedure and treatment not carried out due to patient leaving prior to being seen by health care provider: Secondary | ICD-10-CM | POA: Insufficient documentation

## 2019-08-22 LAB — URINALYSIS, COMPLETE (UACMP) WITH MICROSCOPIC
Bacteria, UA: NONE SEEN
Bilirubin Urine: NEGATIVE
Glucose, UA: NEGATIVE mg/dL
Hgb urine dipstick: NEGATIVE
Ketones, ur: NEGATIVE mg/dL
Leukocytes,Ua: NEGATIVE
Nitrite: NEGATIVE
Protein, ur: NEGATIVE mg/dL
Specific Gravity, Urine: 1.025 (ref 1.005–1.030)
Squamous Epithelial / HPF: NONE SEEN (ref 0–5)
pH: 6 (ref 5.0–8.0)

## 2019-08-22 LAB — CBC
HCT: 44.4 % (ref 39.0–52.0)
Hemoglobin: 15.3 g/dL (ref 13.0–17.0)
MCH: 27.4 pg (ref 26.0–34.0)
MCHC: 34.5 g/dL (ref 30.0–36.0)
MCV: 79.6 fL — ABNORMAL LOW (ref 80.0–100.0)
Platelets: 278 10*3/uL (ref 150–400)
RBC: 5.58 MIL/uL (ref 4.22–5.81)
RDW: 13.2 % (ref 11.5–15.5)
WBC: 10.3 10*3/uL (ref 4.0–10.5)
nRBC: 0 % (ref 0.0–0.2)

## 2019-08-22 LAB — COMPREHENSIVE METABOLIC PANEL
ALT: 54 U/L — ABNORMAL HIGH (ref 0–44)
AST: 30 U/L (ref 15–41)
Albumin: 4.4 g/dL (ref 3.5–5.0)
Alkaline Phosphatase: 76 U/L (ref 38–126)
Anion gap: 10 (ref 5–15)
BUN: 18 mg/dL (ref 6–20)
CO2: 21 mmol/L — ABNORMAL LOW (ref 22–32)
Calcium: 9.1 mg/dL (ref 8.9–10.3)
Chloride: 111 mmol/L (ref 98–111)
Creatinine, Ser: 0.89 mg/dL (ref 0.61–1.24)
GFR calc Af Amer: >60 mL/min (ref >60)
GFR calc non Af Amer: >60 mL/min (ref >60)
Glucose, Bld: 106 mg/dL — ABNORMAL HIGH (ref 70–99)
Potassium: 3.6 mmol/L (ref 3.5–5.1)
Sodium: 142 mmol/L (ref 135–145)
Total Bilirubin: 0.7 mg/dL (ref 0.3–1.2)
Total Protein: 7.3 g/dL (ref 6.5–8.1)

## 2019-08-22 LAB — LIPASE, BLOOD: Lipase: 27 U/L (ref 11–51)

## 2019-08-22 NOTE — ED Triage Notes (Signed)
Pt arrived via POV with reports of "extreme abdominal pain again" pt states he has been having abdominal pain x 5 months states worse since last night.  Pt reports he has had sludge in gall bladder before.  Pt reports nausea with the pain, but denies diarrhea or vomiting.

## 2019-08-23 ENCOUNTER — Emergency Department
Admission: EM | Admit: 2019-08-23 | Discharge: 2019-08-23 | Disposition: A | Discharge status: Home / Self Care | Payer: Self-pay | Attending: Emergency Medicine | Admitting: Emergency Medicine

## 2019-08-23 ENCOUNTER — Emergency Department: Payer: Self-pay

## 2019-08-23 ENCOUNTER — Other Ambulatory Visit: Payer: Self-pay

## 2019-08-23 DIAGNOSIS — F17220 Nicotine dependence, chewing tobacco, uncomplicated: Secondary | ICD-10-CM | POA: Insufficient documentation

## 2019-08-23 DIAGNOSIS — Z79899 Other long term (current) drug therapy: Secondary | ICD-10-CM | POA: Insufficient documentation

## 2019-08-23 DIAGNOSIS — I10 Essential (primary) hypertension: Secondary | ICD-10-CM | POA: Insufficient documentation

## 2019-08-23 DIAGNOSIS — G8929 Other chronic pain: Secondary | ICD-10-CM | POA: Insufficient documentation

## 2019-08-23 DIAGNOSIS — R109 Unspecified abdominal pain: Secondary | ICD-10-CM | POA: Insufficient documentation

## 2019-08-23 LAB — COMPREHENSIVE METABOLIC PANEL
ALT: 50 U/L — ABNORMAL HIGH (ref 0–44)
AST: 28 U/L (ref 15–41)
Albumin: 4.4 g/dL (ref 3.5–5.0)
Alkaline Phosphatase: 76 U/L (ref 38–126)
Anion gap: 6 (ref 5–15)
BUN: 14 mg/dL (ref 6–20)
CO2: 22 mmol/L (ref 22–32)
Calcium: 9 mg/dL (ref 8.9–10.3)
Chloride: 111 mmol/L (ref 98–111)
Creatinine, Ser: 0.78 mg/dL (ref 0.61–1.24)
GFR calc Af Amer: >60 mL/min (ref >60)
GFR calc non Af Amer: >60 mL/min (ref >60)
Glucose, Bld: 89 mg/dL (ref 70–99)
Potassium: 4.2 mmol/L (ref 3.5–5.1)
Sodium: 139 mmol/L (ref 135–145)
Total Bilirubin: 0.5 mg/dL (ref 0.3–1.2)
Total Protein: 7.3 g/dL (ref 6.5–8.1)

## 2019-08-23 LAB — CBC
HCT: 43 % (ref 39.0–52.0)
Hemoglobin: 15.4 g/dL (ref 13.0–17.0)
MCH: 27.7 pg (ref 26.0–34.0)
MCHC: 35.8 g/dL (ref 30.0–36.0)
MCV: 77.5 fL — ABNORMAL LOW (ref 80.0–100.0)
Platelets: 264 10*3/uL (ref 150–400)
RBC: 5.55 MIL/uL (ref 4.22–5.81)
RDW: 13.3 % (ref 11.5–15.5)
WBC: 8.3 10*3/uL (ref 4.0–10.5)
nRBC: 0 % (ref 0.0–0.2)

## 2019-08-23 LAB — URINALYSIS, COMPLETE (UACMP) WITH MICROSCOPIC
Bacteria, UA: NONE SEEN
Bilirubin Urine: NEGATIVE
Glucose, UA: NEGATIVE mg/dL
Hgb urine dipstick: NEGATIVE
Ketones, ur: NEGATIVE mg/dL
Leukocytes,Ua: NEGATIVE
Nitrite: NEGATIVE
Protein, ur: NEGATIVE mg/dL
Specific Gravity, Urine: 1.023 (ref 1.005–1.030)
Squamous Epithelial / HPF: NONE SEEN (ref 0–5)
pH: 6 (ref 5.0–8.0)

## 2019-08-23 LAB — LIPASE, BLOOD: Lipase: 23 U/L (ref 11–51)

## 2019-08-23 MED ORDER — DIPHENHYDRAMINE HCL 50 MG/ML IJ SOLN
12.5000 mg | Freq: Once | INTRAMUSCULAR | Status: AC
  Administered 2019-08-23: 12.5 mg via INTRAVENOUS
  Filled 2019-08-23: qty 1

## 2019-08-23 MED ORDER — HALOPERIDOL LACTATE 5 MG/ML IJ SOLN
2.0000 mg | Freq: Once | INTRAMUSCULAR | Status: AC
  Administered 2019-08-23: 20:00:00 2 mg via INTRAVENOUS
  Filled 2019-08-23: qty 1

## 2019-08-23 MED ORDER — PROMETHAZINE HCL 25 MG PO TABS: 25.0000 mg | ORAL_TABLET | Freq: Three times a day (TID) | ORAL | 0 refills | Status: DC | PRN

## 2019-08-23 MED ORDER — DIPHENHYDRAMINE HCL 50 MG/ML IJ SOLN: 25.0000 mg | Freq: Once | INTRAMUSCULAR | Status: DC

## 2019-08-23 MED ORDER — SODIUM CHLORIDE 0.9% FLUSH: 3.0000 mL | Freq: Once | INTRAVENOUS | Status: DC

## 2019-08-23 MED ORDER — HYOSCYAMINE SULFATE 0.125 MG PO TBDP
0.2500 mg | ORAL_TABLET | Freq: Once | ORAL | Status: AC
  Administered 2019-08-23: 20:00:00 0.25 mg via ORAL
  Filled 2019-08-23: qty 2

## 2019-08-23 MED ORDER — HALOPERIDOL LACTATE 5 MG/ML IJ SOLN
2.5000 mg | Freq: Once | INTRAMUSCULAR | Status: AC
  Administered 2019-08-23: 2.5 mg via INTRAVENOUS
  Filled 2019-08-23: qty 1

## 2019-08-23 MED ORDER — SODIUM CHLORIDE 0.9 % IV BOLUS
1000.0000 mL | Freq: Once | INTRAVENOUS | Status: AC
  Administered 2019-08-23: 20:00:00 1000 mL via INTRAVENOUS

## 2019-08-23 MED ORDER — HYOSCYAMINE SULFATE 0.125 MG PO TABS: 0.1250 mg | ORAL_TABLET | Freq: Four times a day (QID) | ORAL | 0 refills | Status: DC | PRN

## 2019-08-23 NOTE — ED Notes (Signed)
Pt called several times from lobby with no answer

## 2019-08-23 NOTE — ED Notes (Signed)
Pt called several times from lobby with no answer 

## 2019-08-23 NOTE — ED Notes (Signed)
Pt tolerated water and states he feels a little better

## 2019-08-23 NOTE — ED Notes (Signed)
Called Jason in Pharmacy and they will send up medication

## 2019-08-23 NOTE — ED Provider Notes (Signed)
[name] Emergency Department Provider Note  ____________________________________________   First MD Initiated Contact with Patient 08/23/19 1844     (approximate)  I have reviewed the triage vital signs and the nursing notes.   HISTORY  Chief Complaint Abdominal Pain    HPI Luke Conway. is a 28 y.o. male  With h/o recurrent GERD, HTN, chronic RLQ abd pain s/p multiple CT and colonoscopies here w/ lower abd pain. Pt reports that for the past 3-4 days, he's had recurrence of his severe lower abd pain. It is primarily RLQ but also LLQ, which is in the "exact same" position and of the same quality as his chronic pain. He does not know what has triggered it. He tried his previously prescribed Bentyl w/o relief. He reports that over the past day, he's also developed nausea, mild loose stools. Pt has undergone extensive w/u for this including neg colonoscopies and recurrent w/u. No fever, chills. No urinary symptoms.        Past Medical History:  Diagnosis Date  . Anxiety   . CVA (cerebral vascular accident) [facility])    pt reports was "stress/anxiety event", not stroke  . GERD (gastroesophageal reflux disease)   . Hyperlipidemia     Patient Active Problem List   Diagnosis Date Noted  . Abnormal computed tomography of small intestine   . RLQ abdominal pain   . Rectal polyp   . Anxiety disorder, unspecified 05/15/2019  . Left-sided weakness 08/19/2017  . GERD (gastroesophageal reflux disease) 08/19/2017  . History of stroke 08/19/2017  . Tobacco use disorder 08/03/2017  . Acute ischemic stroke (HCC) 08/02/2017  . Abdominal pain 04/22/2014  . Diarrhea 04/22/2014    Past Surgical History:  Procedure Laterality Date  . BIOPSY N/A 05/22/2019   Procedure: BIOPSY;  Surgeon: Midge Minium, MD;  Location: [facility];  Service: Endoscopy;  Laterality: N/A;  . COLONOSCOPY  2015  . COLONOSCOPY WITH PROPOFOL N/A 05/22/2019   Procedure:  COLONOSCOPY WITH PROPOFOL;  Surgeon: Midge Minium, MD;  Location: [facility];  Service: Endoscopy;  Laterality: N/A;  . POLYPECTOMY  05/22/2019   Procedure: POLYPECTOMY;  Surgeon: Midge Minium, MD;  Location: [facility];  Service: Endoscopy;;    Prior to Admission medications   Medication Sig Start Date End Date Taking? Authorizing Provider  cyclobenzaprine (FLEXERIL) 5 MG tablet Take 1 tablet (5 mg total) by mouth 3 (three) times daily as needed for muscle spasms (do not drive on this med). 07/28/18   Jeanmarie Plant, MD  dicyclomine (BENTYL) 20 MG tablet Take 1 tablet (20 mg total) by mouth 3 (three) times daily as needed for spasms. 06/19/19 06/18/20  Jene Every, MD  escitalopram (LEXAPRO) 10 MG tablet Take by mouth. 12/13/18   [provider]  esomeprazole (NEXIUM) 20 MG capsule Take 20 mg by mouth daily at 12 noon.    [provider]  hyoscyamine (LEVSIN) 0.125 MG tablet Take 1 tablet (0.125 mg total) by mouth every 6 (six) hours as needed for cramping. 08/23/19   Shaune Pollack, MD  promethazine (PHENERGAN) 25 MG tablet Take 1 tablet (25 mg total) by mouth every 8 (eight) hours as needed for nausea or vomiting. 08/23/19   Shaune Pollack, MD    Allergies Patient has no known allergies.  Family History  Problem Relation Age of Onset  . Diabetes Mother   . Heart attack Mother   . Crohn's disease Father   . Hypertension Father  Social History Social History   Tobacco Use  . Smoking status: Former Smoker    Packs/day: 0.50    Years: 10.00    Pack years: 5.00    Quit date: 07/27/2017    Years since quitting: 2.0  . Smokeless tobacco: Current User    Types: Snuff, Chew  . Tobacco comment: since age 28  Substance Use Topics  . Alcohol use: Yes    Alcohol/week: 6.0 standard drinks    Types: 6 Cans of beer per week    Comment: 3-4 beers a week  . Drug use: No    Review of Systems  Review of Systems  Constitutional: Positive for  fatigue. Negative for chills and fever.  HENT: Negative for sore throat.   Respiratory: Negative for shortness of breath.   Cardiovascular: Negative for chest pain.  Gastrointestinal: Positive for abdominal pain and nausea.  Genitourinary: Negative for flank pain.  Musculoskeletal: Negative for neck pain.  Skin: Negative for rash and wound.  Allergic/Immunologic: Negative for immunocompromised state.  Neurological: Negative for weakness and numbness.  Hematological: Does not bruise/bleed easily.  All other systems reviewed and are negative.    ____________________________________________  PHYSICAL EXAM:      VITAL SIGNS: ED Triage Vitals  Enc Vitals Group     BP 08/23/19 1757 116/68     Pulse Rate 08/23/19 1757 60     Resp 08/23/19 1757 19     Temp 08/23/19 1757 97.8 F (36.6 C)     Temp Source 08/23/19 1757 Oral     SpO2 08/23/19 1757 99 %     Weight 08/23/19 1757 180 lb (81.6 kg)     Height 08/23/19 1757 5\' 7"  (1.702 m)     Head Circumference --      Peak Flow --      Pain Score 08/23/19 1815 10     Pain Loc --      Pain Edu? --      Excl. in GC? --      Physical Exam Vitals signs and nursing note reviewed.  Constitutional:      General: He is not in acute distress.    Appearance: He is well-developed.  HENT:     Head: Normocephalic and atraumatic.  Eyes:     Conjunctiva/sclera: Conjunctivae normal.  Neck:     Musculoskeletal: Neck supple.  Cardiovascular:     Rate and Rhythm: Normal rate and regular rhythm.     Heart sounds: Normal heart sounds. No murmur. No friction rub.  Pulmonary:     Effort: Pulmonary effort is normal. No respiratory distress.     Breath sounds: Normal breath sounds. No wheezing or rales.  Abdominal:     General: There is no distension.     Palpations: Abdomen is soft.     Tenderness: There is abdominal tenderness in the right lower quadrant, periumbilical area, suprapubic area and left lower quadrant.  Skin:    General: Skin is  warm.     Capillary Refill: Capillary refill takes less than 2 seconds.  Neurological:     Mental Status: He is alert and oriented to person, place, and time.     Motor: No abnormal muscle tone.       ____________________________________________   LABS (all labs ordered are listed, but only abnormal results are displayed)  Labs Reviewed  COMPREHENSIVE METABOLIC PANEL - Abnormal; Notable for the following components:      Result Value   ALT 50 (*)  All other components within normal limits  CBC - Abnormal; Notable for the following components:   MCV 77.5 (*)    All other components within normal limits  URINALYSIS, COMPLETE (UACMP) WITH MICROSCOPIC - Abnormal; Notable for the following components:   Color, Urine YELLOW (*)    APPearance CLEAR (*)    All other components within normal limits  LIPASE, BLOOD    ____________________________________________  EKG: None ________________________________________  RADIOLOGY All imaging, including plain films, CT scans, and ultrasounds, independently reviewed by me, and interpretations confirmed via formal radiology reads.  ED MD interpretation:   none  Official radiology report(s): No results found.  ____________________________________________  PROCEDURES   Procedure(s) performed (including Critical Care):  Procedures  ____________________________________________  INITIAL IMPRESSION / MDM / ASSESSMENT AND PLAN / ED COURSE  As part of my medical decision making, I reviewed the following data within the electronic MEDICAL RECORD NUMBER Nursing notes reviewed and incorporated, Old chart reviewed, Notes from prior ED visits, and [place] Controlled Substance Database       *Luke Conway. was evaluated in Emergency Department on 08/23/2019 for the symptoms described in the history of present illness. He was evaluated in the context of the global COVID-19 pandemic, which necessitated consideration that the patient might be at  risk for infection with the SARS-CoV-2 virus that causes COVID-19. Institutional protocols and algorithms that pertain to the evaluation of patients at risk for COVID-19 are in a state of rapid change based on information released by regulatory bodies including the CDC and federal and state organizations. These policies and algorithms were followed during the patient's care in the ED.  Some ED evaluations and interventions may be delayed as a result of limited staffing during the pandemic.*     Medical Decision Making:  28 yo M here with recurrent bilateral lower quadrant abd pain. H/o same. Labs today are very reassuring with normal WBC, normal Hgb, normal renal function and no signs of significant hepatitis or pancreatitis. UA without UTI.  I reviewed pt's extensive h/o similar pain. He has had multiple CT scans for this as well as colonoscopy, with reassuring bx and no signs of Crohn's. His pain is consistently in the RLQ>LLQ.   Had a long discussion with pt re: his labs, presentation. He is very adamant his pain is similar to his usual pain. He feels markedly improved after hyoscyamine and haldol, suggesting a component of chronic abd pain. I discussed options including sx treatment with good follow-up, versus CT imaging today for eval appendix. Given his reassuring labs, stable vitals, normal WBC despite sx >72 hours, he would like to attempt ongoing outpt imaging with no CT at this time. He understands that he should return immediately if his pain worsens, changes, or if he develops any fever or other concerning sx. Pt in agreement.   D/c home.  ____________________________________________  FINAL CLINICAL IMPRESSION(S) / ED DIAGNOSES  Final diagnoses:  Chronic abdominal pain     MEDICATIONS GIVEN DURING THIS VISIT:  Medications  sodium chloride flush (NS) 0.9 % injection 3 mL (3 mLs Intravenous Not Given 08/23/19 1939)  haloperidol lactate (HALDOL) injection 2.5 mg (2.5 mg Intravenous  Given 08/23/19 1943)  sodium chloride 0.9 % bolus 1,000 mL (0 mLs Intravenous Stopped 08/23/19 2058)  hyoscyamine (ANASPAZ) disintergrating tablet 0.25 mg (0.25 mg Oral Given 08/23/19 1938)  haloperidol lactate (HALDOL) injection 2 mg (2 mg Intravenous Given 08/23/19 2024)  diphenhydrAMINE (BENADRYL) injection 12.5 mg (12.5 mg Intravenous Given 08/23/19 2024)  ED Discharge Orders         Ordered    promethazine (PHENERGAN) 25 MG tablet  Every 8 hours PRN     08/23/19 2106    hyoscyamine (LEVSIN) 0.125 MG tablet  Every 6 hours PRN     08/23/19 2106           Note:  This document was prepared using Dragon voice recognition software and may include unintentional dictation errors.   Duffy Bruce, MD 08/23/19 2132

## 2019-08-23 NOTE — ED Notes (Signed)
No answer when called several times from lobby 

## 2019-08-23 NOTE — ED Triage Notes (Signed)
Pt states right side abdominal pain that started early Tuesday morning. Pt states some dizziness. Pt states some nausea and diarrhea.  Pt denies CP but states some SOB.

## 2019-08-23 NOTE — ED Notes (Signed)
Sent rainbow to lab. 

## 2020-05-27 ENCOUNTER — Other Ambulatory Visit: Payer: Self-pay

## 2020-05-27 ENCOUNTER — Emergency Department
Admission: EM | Admit: 2020-05-27 | Discharge: 2020-05-27 | Disposition: A | Discharge status: Home / Self Care | Payer: Self-pay | Attending: Emergency Medicine | Admitting: Emergency Medicine

## 2020-05-27 ENCOUNTER — Encounter: Payer: Self-pay | Admitting: Radiology

## 2020-05-27 ENCOUNTER — Emergency Department: Payer: Self-pay

## 2020-05-27 DIAGNOSIS — R1032 Left lower quadrant pain: Secondary | ICD-10-CM | POA: Insufficient documentation

## 2020-05-27 DIAGNOSIS — R1031 Right lower quadrant pain: Secondary | ICD-10-CM | POA: Insufficient documentation

## 2020-05-27 DIAGNOSIS — R109 Unspecified abdominal pain: Secondary | ICD-10-CM

## 2020-05-27 DIAGNOSIS — R110 Nausea: Secondary | ICD-10-CM | POA: Insufficient documentation

## 2020-05-27 DIAGNOSIS — Z79899 Other long term (current) drug therapy: Secondary | ICD-10-CM | POA: Insufficient documentation

## 2020-05-27 DIAGNOSIS — R059 Cough, unspecified: Secondary | ICD-10-CM

## 2020-05-27 DIAGNOSIS — Z87891 Personal history of nicotine dependence: Secondary | ICD-10-CM | POA: Insufficient documentation

## 2020-05-27 DIAGNOSIS — Z20822 Contact with and (suspected) exposure to covid-19: Secondary | ICD-10-CM | POA: Insufficient documentation

## 2020-05-27 LAB — COMPREHENSIVE METABOLIC PANEL
ALT: 35 U/L (ref 0–44)
AST: 20 U/L (ref 15–41)
Albumin: 4.5 g/dL (ref 3.5–5.0)
Alkaline Phosphatase: 97 U/L (ref 38–126)
Anion gap: 11 (ref 5–15)
BUN: 8 mg/dL (ref 6–20)
CO2: 21 mmol/L — ABNORMAL LOW (ref 22–32)
Calcium: 9.2 mg/dL (ref 8.9–10.3)
Chloride: 109 mmol/L (ref 98–111)
Creatinine, Ser: 0.56 mg/dL — ABNORMAL LOW (ref 0.61–1.24)
GFR calc Af Amer: >60 mL/min (ref >60)
GFR calc non Af Amer: >60 mL/min (ref >60)
Glucose, Bld: 85 mg/dL (ref 70–99)
Potassium: 3.4 mmol/L — ABNORMAL LOW (ref 3.5–5.1)
Sodium: 141 mmol/L (ref 135–145)
Total Bilirubin: 0.9 mg/dL (ref 0.3–1.2)
Total Protein: 7.6 g/dL (ref 6.5–8.1)

## 2020-05-27 LAB — CBC
HCT: 47 % (ref 39.0–52.0)
Hemoglobin: 16.4 g/dL (ref 13.0–17.0)
MCH: 28 pg (ref 26.0–34.0)
MCHC: 34.9 g/dL (ref 30.0–36.0)
MCV: 80.2 fL (ref 80.0–100.0)
Platelets: 258 10*3/uL (ref 150–400)
RBC: 5.86 MIL/uL — ABNORMAL HIGH (ref 4.22–5.81)
RDW: 13.2 % (ref 11.5–15.5)
WBC: 9.1 10*3/uL (ref 4.0–10.5)
nRBC: 0 % (ref 0.0–0.2)

## 2020-05-27 LAB — URINALYSIS, COMPLETE (UACMP) WITH MICROSCOPIC
Bacteria, UA: NONE SEEN
Bilirubin Urine: NEGATIVE
Glucose, UA: NEGATIVE mg/dL
Hgb urine dipstick: NEGATIVE
Ketones, ur: NEGATIVE mg/dL
Leukocytes,Ua: NEGATIVE
Nitrite: NEGATIVE
Protein, ur: NEGATIVE mg/dL
Specific Gravity, Urine: 1.034 — ABNORMAL HIGH (ref 1.005–1.030)
pH: 5 (ref 5.0–8.0)

## 2020-05-27 LAB — SARS CORONAVIRUS 2 BY RT PCR (HOSPITAL ORDER, PERFORMED IN ~~LOC~~ HOSPITAL LAB): SARS Coronavirus 2: NEGATIVE

## 2020-05-27 LAB — LIPASE, BLOOD: Lipase: 25 U/L (ref 11–51)

## 2020-05-27 MED ORDER — HALOPERIDOL LACTATE 5 MG/ML IJ SOLN: 2.0000 mg | Freq: Once | INTRAMUSCULAR | Status: DC

## 2020-05-27 MED ORDER — HYOSCYAMINE SULFATE 0.125 MG PO TABS: 0.2500 mg | ORAL_TABLET | Freq: Once | ORAL | Status: DC

## 2020-05-27 MED ORDER — DICYCLOMINE HCL 10 MG/ML IM SOLN
20.0000 mg | Freq: Once | INTRAMUSCULAR | Status: AC
  Administered 2020-05-27: 20 mg via INTRAMUSCULAR
  Filled 2020-05-27: qty 2

## 2020-05-27 MED ORDER — GUAIFENESIN 100 MG/5ML PO SOLN: 15.0000 mL | ORAL | 0 refills | Status: DC | PRN

## 2020-05-27 MED ORDER — ONDANSETRON 8 MG PO TBDP: 8.0000 mg | ORAL_TABLET | Freq: Three times a day (TID) | ORAL | 0 refills | Status: DC | PRN

## 2020-05-27 MED ORDER — ALBUTEROL SULFATE HFA 108 (90 BASE) MCG/ACT IN AERS: 2.0000 | INHALATION_SPRAY | Freq: Four times a day (QID) | RESPIRATORY_TRACT | 0 refills | Status: DC | PRN

## 2020-05-27 MED ORDER — HYOSCYAMINE SULFATE 0.125 MG PO TBDP: 0.1250 mg | ORAL_TABLET | Freq: Four times a day (QID) | ORAL | 0 refills | Status: DC | PRN

## 2020-05-27 MED ORDER — ONDANSETRON 4 MG PO TBDP
8.0000 mg | ORAL_TABLET | Freq: Once | ORAL | Status: AC
  Administered 2020-05-27: 8 mg via ORAL
  Filled 2020-05-27: qty 2

## 2020-05-27 NOTE — ED Provider Notes (Signed)
[facility] Emergency Department Provider Note ____________________________________________   First MD Initiated Contact with Patient 05/27/20 [phone]     (approximate)  I have reviewed the triage vital signs and the nursing notes.   HISTORY  Chief Complaint Abdominal Pain and Cough    HPI Luke Shein. is a 29 y.o. male with PMH as noted below who presents with 2 primary complaints.  1.  Abdominal pain: The patient has had bilateral lower abdominal pain, right more than left, since yesterday evening.  The pain is persistent, associated with nausea, but not with any diarrhea or change in his bowel movements.  He has no fever or chills.  The patient states that he has had similar chronic abdominal pain for approximately 7 years, and states that this current pain is exactly like his prior flares.  2.  Cough: The patient reports cough over the last day, productive of a small amount of phlegm, and associated with mild shortness of breath that seems to have resolved.  He denies any fevers.  He denies any known Covid exposures.   Past Medical History:  Diagnosis Date  . Anxiety   . CVA (cerebral vascular accident) [facility])    pt reports was "stress/anxiety event", not stroke  . GERD (gastroesophageal reflux disease)   . Hyperlipidemia     Patient Active Problem List   Diagnosis Date Noted  . Abnormal computed tomography of small intestine   . RLQ abdominal pain   . Rectal polyp   . Anxiety disorder, unspecified 05/15/2019  . Left-sided weakness 08/19/2017  . GERD (gastroesophageal reflux disease) 08/19/2017  . History of stroke 08/19/2017  . Tobacco use disorder 08/03/2017  . Acute ischemic stroke (HCC) 08/02/2017  . Abdominal pain 04/22/2014  . Diarrhea 04/22/2014    Past Surgical History:  Procedure Laterality Date  . BIOPSY N/A 05/22/2019   Procedure: BIOPSY;  Surgeon: Midge Minium, MD;  Location: [facility];  Service: Endoscopy;   Laterality: N/A;  . COLONOSCOPY  2015  . COLONOSCOPY WITH PROPOFOL N/A 05/22/2019   Procedure: COLONOSCOPY WITH PROPOFOL;  Surgeon: Midge Minium, MD;  Location: [facility];  Service: Endoscopy;  Laterality: N/A;  . POLYPECTOMY  05/22/2019   Procedure: POLYPECTOMY;  Surgeon: Midge Minium, MD;  Location: [facility];  Service: Endoscopy;;    Prior to Admission medications   Medication Sig Start Date End Date Taking? Authorizing Provider  albuterol (VENTOLIN HFA) 108 (90 Base) MCG/ACT inhaler Inhale 2 puffs into the lungs every 6 (six) hours as needed for shortness of breath. 05/27/20   Dionne Bucy, MD  cyclobenzaprine (FLEXERIL) 5 MG tablet Take 1 tablet (5 mg total) by mouth 3 (three) times daily as needed for muscle spasms (do not drive on this med). 07/28/18   Jeanmarie Plant, MD  dicyclomine (BENTYL) 20 MG tablet Take 1 tablet (20 mg total) by mouth 3 (three) times daily as needed for spasms. 06/19/19 06/18/20  Jene Every, MD  escitalopram (LEXAPRO) 10 MG tablet Take by mouth. 12/13/18   [provider]  esomeprazole (NEXIUM) 20 MG capsule Take 20 mg by mouth daily at 12 noon.    [provider]  guaiFENesin (ROBITUSSIN) 100 MG/5ML SOLN Take 15 mLs (300 mg total) by mouth every 4 (four) hours as needed for cough or to loosen phlegm. 05/27/20   Dionne Bucy, MD  hyoscyamine (ANASPAZ) 0.125 MG TBDP disintergrating tablet Place 1 tablet (0.125 mg total) under the tongue every 6 (six) hours  as needed for up to 5 days (abdominal pain). 05/27/20 06/01/20  Dionne Bucy, MD  ondansetron (ZOFRAN ODT) 8 MG disintegrating tablet Take 1 tablet (8 mg total) by mouth every 8 (eight) hours as needed for nausea or vomiting. 05/27/20   Dionne Bucy, MD  promethazine (PHENERGAN) 25 MG tablet Take 1 tablet (25 mg total) by mouth every 8 (eight) hours as needed for nausea or vomiting. 08/23/19   Shaune Pollack, MD    Allergies Patient has no known  allergies.  Family History  Problem Relation Age of Onset  . Diabetes Mother   . Heart attack Mother   . Crohn's disease Father   . Hypertension Father     Social History Social History   Tobacco Use  . Smoking status: Former Smoker    Packs/day: 0.50    Years: 10.00    Pack years: 5.00    Quit date: 07/27/2017    Years since quitting: 2.8  . Smokeless tobacco: Current User    Types: Snuff, Chew  . Tobacco comment: since age 50  Vaping Use  . Vaping Use: Never used  Substance Use Topics  . Alcohol use: Yes    Alcohol/week: 6.0 standard drinks    Types: 6 Cans of beer per week    Comment: 3-4 beers a week  . Drug use: No    Review of Systems  Constitutional: No fever/chills. Eyes: No visual changes. ENT: No sore throat. Cardiovascular: Denies chest pain. Respiratory: Positive for resolved shortness of breath. Gastrointestinal: Positive for nausea.  No vomiting or diarrhea.  Genitourinary: Negative for dysuria.  Musculoskeletal: Negative for back pain. Skin: Negative for rash. Neurological: Negative for headache.   ____________________________________________   PHYSICAL EXAM:  VITAL SIGNS: ED Triage Vitals  Enc Vitals Group     BP 05/27/20 0445 105/65     Pulse Rate 05/27/20 0445 66     Resp 05/27/20 0445 18     Temp 05/27/20 0445 98.2 F (36.8 C)     Temp Source 05/27/20 0445 Oral     SpO2 05/27/20 0445 98 %     Weight 05/27/20 0443 185 lb (83.9 kg)     Height 05/27/20 0443 5\' 7"  (1.702 m)     Head Circumference --      Peak Flow --      Pain Score 05/27/20 0443 9     Pain Loc --      Pain Edu? --      Excl. in GC? --     Constitutional: Alert and oriented. Well appearing and in no acute distress. Eyes: Conjunctivae are normal.  Head: Atraumatic. Nose: No congestion/rhinnorhea. Mouth/Throat: Mucous membranes are moist.   Neck: Normal range of motion.  Cardiovascular: Normal rate, regular rhythm.  Good peripheral circulation. Respiratory:  Normal respiratory effort.  Lungs CTAB.  No retractions.  Gastrointestinal: Soft with mild right lower quadrant tenderness.  No peritoneal signs.  No distention.  Genitourinary: No flank tenderness. Musculoskeletal: Extremities warm and well perfused.  Neurologic:  Normal speech and language. No gross focal neurologic deficits are appreciated.  Skin:  Skin is warm and dry. No rash noted. Psychiatric: Mood and affect are normal. Speech and behavior are normal.  ____________________________________________   LABS (all labs ordered are listed, but only abnormal results are displayed)  Labs Reviewed  COMPREHENSIVE METABOLIC PANEL - Abnormal; Notable for the following components:      Result Value   Potassium 3.4 (*)    CO2 21 (*)  Creatinine, Ser 0.56 (*)    All other components within normal limits  CBC - Abnormal; Notable for the following components:   RBC 5.86 (*)    All other components within normal limits  URINALYSIS, COMPLETE (UACMP) WITH MICROSCOPIC - Abnormal; Notable for the following components:   Color, Urine YELLOW (*)    APPearance HAZY (*)    Specific Gravity, Urine 1.034 (*)    All other components within normal limits  SARS CORONAVIRUS 2 BY RT PCR (HOSPITAL ORDER, PERFORMED IN [facility])  LIPASE, BLOOD   ____________________________________________  EKG   ____________________________________________  RADIOLOGY  CXR: No focal infiltrate or edema  ____________________________________________   PROCEDURES  Procedure(s) performed: No  Procedures  Critical Care performed: No ____________________________________________   INITIAL IMPRESSION / ASSESSMENT AND PLAN / ED COURSE  Pertinent labs & imaging results that were available during my care of the patient were reviewed by me and considered in my medical decision making (see chart for details).  29 year old male with PMH as noted above presents with lower abdominal pain as well as a  cough and mild shortness of breath.  1.  Abdominal pain: Per the patient's history, this appears to be a flare of chronic pain.  The patient states that the current episode is exactly like he has had previously in location and quality.  I reviewed the past medical records in epic.  The patient was most recently seen in the ED on 08/23/2019 for a similar presentation.  At that time based on shared decision making he declined imaging.  He was treated with Haldol and hyoscyamine.  Previously he was seen in the ED in September of last year and treated successfully with Bentyl.  He had a renal stone study in July 2020 showing terminal ileitis or mesenteric adenitis and a normal appendix.  He has had multiple prior CTs which were negative.  Overall this is consistent with an exacerbation of his chronic pain.  Lab work-up obtained from triage is within normal limits.  The patient is afebrile and well-appearing.  He has no peritoneal signs.  I discussed further work-up with him including risks and benefits of CT or other imaging.  At this time, the patient declines imaging and would prefer symptomatic treatment.  We will try Bentyl and Zofran.  2.  Cough/shortness of breath: The patient's vital signs are normal.  He is not requiring any oxygen.  He has no increased work of breathing or respiratory distress.  Overall I suspect bronchitis or other benign etiology.  Chest x-ray shows no focal infiltrate.  We will test the patient for Covid.  ----------------------------------------- 11:10 AM on 05/27/2020 -----------------------------------------  The patient reports that his pain has resolved after Bentyl and Zofran.  He appears comfortable.  He states he feels well to go home.  There continues to be no evidence of acute appendicitis or other indication for imaging or further work-up at this time.  I will prescribe hyoscyamine and Zofran for home, which has worked well in the past.  He is Covid negative.  I  prescribed albuterol and guaifenesin for symptomatic treatment.  Return precautions given, and he expresses understanding. ____________________________________________   FINAL CLINICAL IMPRESSION(S) / ED DIAGNOSES  Final diagnoses:  Chronic abdominal pain  Cough      NEW MEDICATIONS STARTED DURING THIS VISIT:  New Prescriptions   ALBUTEROL (VENTOLIN HFA) 108 (90 BASE) MCG/ACT INHALER    Inhale 2 puffs into the lungs every 6 (six) hours as needed  for shortness of breath.   GUAIFENESIN (ROBITUSSIN) 100 MG/5ML SOLN    Take 15 mLs (300 mg total) by mouth every 4 (four) hours as needed for cough or to loosen phlegm.   HYOSCYAMINE (ANASPAZ) 0.125 MG TBDP DISINTERGRATING TABLET    Place 1 tablet (0.125 mg total) under the tongue every 6 (six) hours as needed for up to 5 days (abdominal pain).   ONDANSETRON (ZOFRAN ODT) 8 MG DISINTEGRATING TABLET    Take 1 tablet (8 mg total) by mouth every 8 (eight) hours as needed for nausea or vomiting.     Note:  This document was prepared using Dragon voice recognition software and may include unintentional dictation errors.    Dionne BucySiadecki, Kirsi Hugh, MD 05/27/20 [id number]

## 2020-05-27 NOTE — Discharge Instructions (Addendum)
For the abdominal pain, you may take the hyoscyamine along with Zofran (ondansetron) for nausea.  You may use albuterol inhaler 2 puffs up to every 4-6 hours for shortness of breath, and the guaifenesin for cough.  Return to the ER for new, worsening, or persistent severe cough, shortness of breath, abdominal pain, fever, vomiting, or any other new or worsening symptoms that concern you.

## 2020-05-27 NOTE — ED Triage Notes (Addendum)
Pt arrived via POV with c/o abdominal pain on the right side, cough and difficulty breathing, pt able to talk in complete sentences, states cough is productive at times.  Denies any N/V/D. Sxs began overnight.

## 2020-10-02 ENCOUNTER — Other Ambulatory Visit: Payer: Self-pay

## 2020-10-02 ENCOUNTER — Emergency Department
Admission: EM | Admit: 2020-10-02 | Discharge: 2020-10-02 | Disposition: A | Discharge status: Home / Self Care | Payer: Self-pay | Attending: Emergency Medicine | Admitting: Emergency Medicine

## 2020-10-02 ENCOUNTER — Encounter: Payer: Self-pay | Admitting: Emergency Medicine

## 2020-10-02 DIAGNOSIS — F17290 Nicotine dependence, other tobacco product, uncomplicated: Secondary | ICD-10-CM | POA: Insufficient documentation

## 2020-10-02 DIAGNOSIS — Z20822 Contact with and (suspected) exposure to covid-19: Secondary | ICD-10-CM | POA: Insufficient documentation

## 2020-10-02 DIAGNOSIS — F17220 Nicotine dependence, chewing tobacco, uncomplicated: Secondary | ICD-10-CM | POA: Insufficient documentation

## 2020-10-02 DIAGNOSIS — J111 Influenza due to unidentified influenza virus with other respiratory manifestations: Secondary | ICD-10-CM | POA: Insufficient documentation

## 2020-10-02 MED ORDER — ONDANSETRON 4 MG PO TBDP: 4.0000 mg | ORAL_TABLET | Freq: Three times a day (TID) | ORAL | 0 refills | Status: DC | PRN

## 2020-10-02 MED ORDER — NAPROXEN 500 MG PO TABS: 500.0000 mg | ORAL_TABLET | Freq: Two times a day (BID) | ORAL | 0 refills | Status: DC

## 2020-10-02 NOTE — ED Triage Notes (Signed)
First Nurse Note:  Arrives c/o painful cough, sob since Monday.  AAOx3. Skin warm and dry no apparent distress.  No SOB/ DOE noted

## 2020-10-02 NOTE — ED Provider Notes (Signed)
[facility] Emergency Department Provider Note  ____________________________________________  Time seen: Approximately 1:58 PM  I have reviewed the triage vital signs and the nursing notes.   HISTORY  Chief Complaint Shortness of Breath (Painful cough/)    HPI Luke Conway. is a 30 y.o. male with a history of anxiety, GERD, hyperlipidemia who comes to ED complaining of nonproductive cough, shortness of breath, fatigue, body aches for the past 2 days.  He notes that his girlfriend that he lives with started getting similar symptoms 3 days ago, and yesterday tested positive for COVID.  No significant exertional symptoms.  Eating and drinking normally, no vomiting diarrhea palpitations dizziness or syncope.  Symptoms are gradual onset, constant, waxing and waning, no aggravating or alleviating factors.      Past Medical History:  Diagnosis Date  . Anxiety   . CVA (cerebral vascular accident) [facility])    pt reports was "stress/anxiety event", not stroke  . GERD (gastroesophageal reflux disease)   . Hyperlipidemia      Patient Active Problem List   Diagnosis Date Noted  . Abnormal computed tomography of small intestine   . RLQ abdominal pain   . Rectal polyp   . Anxiety disorder, unspecified 05/15/2019  . Left-sided weakness 08/19/2017  . GERD (gastroesophageal reflux disease) 08/19/2017  . History of stroke 08/19/2017  . Tobacco use disorder 08/03/2017  . Acute ischemic stroke (HCC) 08/02/2017  . Abdominal pain 04/22/2014  . Diarrhea 04/22/2014     Past Surgical History:  Procedure Laterality Date  . BIOPSY N/A 05/22/2019   Procedure: BIOPSY;  Surgeon: Midge Minium, MD;  Location: [facility];  Service: Endoscopy;  Laterality: N/A;  . COLONOSCOPY  2015  . COLONOSCOPY WITH PROPOFOL N/A 05/22/2019   Procedure: COLONOSCOPY WITH PROPOFOL;  Surgeon: Midge Minium, MD;  Location: [facility];  Service: Endoscopy;  Laterality: N/A;  .  POLYPECTOMY  05/22/2019   Procedure: POLYPECTOMY;  Surgeon: Midge Minium, MD;  Location: [facility];  Service: Endoscopy;;     Prior to Admission medications   Medication Sig Start Date End Date Taking? Authorizing Provider  naproxen (NAPROSYN) 500 MG tablet Take 1 tablet (500 mg total) by mouth 2 (two) times daily with a meal. 10/02/20  Yes Sharman Cheek, MD  ondansetron (ZOFRAN ODT) 4 MG disintegrating tablet Take 1 tablet (4 mg total) by mouth every 8 (eight) hours as needed for nausea or vomiting. 10/02/20  Yes Sharman Cheek, MD  albuterol (VENTOLIN HFA) 108 (90 Base) MCG/ACT inhaler Inhale 2 puffs into the lungs every 6 (six) hours as needed for shortness of breath. 05/27/20   Dionne Bucy, MD  cyclobenzaprine (FLEXERIL) 5 MG tablet Take 1 tablet (5 mg total) by mouth 3 (three) times daily as needed for muscle spasms (do not drive on this med). 07/28/18   Jeanmarie Plant, MD  dicyclomine (BENTYL) 20 MG tablet Take 1 tablet (20 mg total) by mouth 3 (three) times daily as needed for spasms. 06/19/19 06/18/20  Jene Every, MD  escitalopram (LEXAPRO) 10 MG tablet Take by mouth. 12/13/18   [provider]  esomeprazole (NEXIUM) 20 MG capsule Take 20 mg by mouth daily at 12 noon.    [provider]  guaiFENesin (ROBITUSSIN) 100 MG/5ML SOLN Take 15 mLs (300 mg total) by mouth every 4 (four) hours as needed for cough or to loosen phlegm. 05/27/20   Dionne Bucy, MD  hyoscyamine (ANASPAZ) 0.125 MG TBDP disintergrating tablet Place 1 tablet (0.125  mg total) under the tongue every 6 (six) hours as needed for up to 5 days (abdominal pain). 05/27/20 06/01/20  Dionne Bucy, MD  promethazine (PHENERGAN) 25 MG tablet Take 1 tablet (25 mg total) by mouth every 8 (eight) hours as needed for nausea or vomiting. 08/23/19   Shaune Pollack, MD     Allergies Patient has no known allergies.   Family History  Problem Relation Age of Onset  . Diabetes Mother   .  Heart attack Mother   . Crohn's disease Father   . Hypertension Father     Social History Social History   Tobacco Use  . Smoking status: Former Smoker    Packs/day: 0.50    Years: 10.00    Pack years: 5.00    Quit date: 07/27/2017    Years since quitting: 3.1  . Smokeless tobacco: Current User    Types: Snuff, Chew  . Tobacco comment: since age 64  Vaping Use  . Vaping Use: Never used  Substance Use Topics  . Alcohol use: Yes    Alcohol/week: 6.0 standard drinks    Types: 6 Cans of beer per week    Comment: 3-4 beers a week  . Drug use: No    Review of Systems  Constitutional: Positive chills.  ENT:   No sore throat. No rhinorrhea. Cardiovascular:   No chest pain or syncope. Respiratory:   Positive shortness of breath and cough. Gastrointestinal:   Negative for abdominal pain, vomiting and diarrhea.  Musculoskeletal:   Negative for focal pain or swelling All other systems reviewed and are negative except as documented above in ROS and HPI.  ____________________________________________   PHYSICAL EXAM:  VITAL SIGNS: ED Triage Vitals  Enc Vitals Group     BP 10/02/20 1256 122/68     Pulse Rate 10/02/20 1256 87     Resp 10/02/20 1256 18     Temp 10/02/20 1256 97.7 F (36.5 C)     Temp Source 10/02/20 1256 Oral     SpO2 10/02/20 1256 99 %     Weight 10/02/20 1243 184 lb 15.5 oz (83.9 kg)     Height 10/02/20 1243 5\' 7"  (1.702 m)     Head Circumference --      Peak Flow --      Pain Score 10/02/20 1243 0     Pain Loc --      Pain Edu? --      Excl. in GC? --     Vital signs reviewed, nursing assessments reviewed.   Constitutional:   Alert and oriented. Non-toxic appearance. Eyes:   Conjunctivae are normal. EOMI. PERRL. ENT      Head:   Normocephalic and atraumatic.      Nose:   Wearing a mask.      Mouth/Throat:   Wearing a mask.      Neck:   No meningismus. Full ROM. Hematological/Lymphatic/Immunilogical:   No cervical  lymphadenopathy. Cardiovascular:   RRR. Symmetric bilateral radial and DP pulses.  No murmurs. Cap refill less than 2 seconds. Respiratory:   Normal respiratory effort without tachypnea/retractions. Breath sounds are clear and equal bilaterally. No wheezes/rales/rhonchi. Gastrointestinal:   Soft and nontender. Non distended. There is no CVA tenderness.  No rebound, rigidity, or guarding.  Musculoskeletal:   Normal range of motion in all extremities. No joint effusions.  No lower extremity tenderness.  No edema. Neurologic:   Normal speech and language.  Motor grossly intact. No acute focal neurologic deficits are appreciated.  Skin:    Skin is warm, dry and intact. No rash noted.  No petechiae, purpura, or bullae.  ____________________________________________    LABS (pertinent positives/negatives) (all labs ordered are listed, but only abnormal results are displayed) Labs Reviewed  SARS CORONAVIRUS 2 (TAT 6-24 HRS)   ____________________________________________   EKG    ____________________________________________    RADIOLOGY  No results found.  ____________________________________________   PROCEDURES Procedures  ____________________________________________    CLINICAL IMPRESSION / ASSESSMENT AND PLAN / ED COURSE  Medications ordered in the ED: Medications - No data to display  Pertinent labs & imaging results that were available during my care of the patient were reviewed by me and considered in my medical decision making (see chart for details).  Luke Conway. was evaluated in Emergency Department on 10/02/2020 for the symptoms described in the history of present illness. He was evaluated in the context of the global COVID-19 pandemic, which necessitated consideration that the patient might be at risk for infection with the SARS-CoV-2 virus that causes COVID-19. Institutional protocols and algorithms that pertain to the evaluation of patients at risk for  COVID-19 are in a state of rapid change based on information released by regulatory bodies including the CDC and federal and state organizations. These policies and algorithms were followed during the patient's care in the ED.   Patient presents with multiple symptoms consistent with influenza-like illness/COVID.  Household member just tested positive for COVID yesterday, and he reports multiple of his family members also have COVID currently.   Considering the patient's symptoms, medical history, and physical examination today, I have low suspicion for ACS, PE, TAD, pneumothorax, carditis, mediastinitis, pneumonia, CHF, or sepsis.  Will send COVID PCR, recommend supportive care with NSAIDs, Zofran, hydration.  Counseled patient on isolation.      ____________________________________________   FINAL CLINICAL IMPRESSION(S) / ED DIAGNOSES    Final diagnoses:  Influenza-like illness     ED Discharge Orders         Ordered    naproxen (NAPROSYN) 500 MG tablet  2 times daily with meals        10/02/20 1357    ondansetron (ZOFRAN ODT) 4 MG disintegrating tablet  Every 8 hours PRN        10/02/20 1357          Portions of this note were generated with dragon dictation software. Dictation errors may occur despite best attempts at proofreading.   Sharman Cheek, MD 10/02/20 1359

## 2020-10-03 LAB — SARS CORONAVIRUS 2 (TAT 6-24 HRS): SARS Coronavirus 2: NEGATIVE

## 2020-12-16 ENCOUNTER — Other Ambulatory Visit: Payer: Self-pay

## 2020-12-16 ENCOUNTER — Emergency Department: Payer: Self-pay

## 2020-12-16 ENCOUNTER — Emergency Department
Admission: EM | Admit: 2020-12-16 | Discharge: 2020-12-16 | Disposition: A | Discharge status: Home / Self Care | Payer: Self-pay | Attending: Emergency Medicine | Admitting: Emergency Medicine

## 2020-12-16 DIAGNOSIS — R202 Paresthesia of skin: Secondary | ICD-10-CM | POA: Insufficient documentation

## 2020-12-16 DIAGNOSIS — R2990 Unspecified symptoms and signs involving the nervous system: Secondary | ICD-10-CM

## 2020-12-16 DIAGNOSIS — R531 Weakness: Secondary | ICD-10-CM | POA: Insufficient documentation

## 2020-12-16 DIAGNOSIS — Z87891 Personal history of nicotine dependence: Secondary | ICD-10-CM | POA: Insufficient documentation

## 2020-12-16 LAB — COMPREHENSIVE METABOLIC PANEL
ALT: 48 U/L — ABNORMAL HIGH (ref 0–44)
AST: 24 U/L (ref 15–41)
Albumin: 4.6 g/dL (ref 3.5–5.0)
Alkaline Phosphatase: 97 U/L (ref 38–126)
Anion gap: 6 (ref 5–15)
BUN: 13 mg/dL (ref 6–20)
CO2: 27 mmol/L (ref 22–32)
Calcium: 9.2 mg/dL (ref 8.9–10.3)
Chloride: 107 mmol/L (ref 98–111)
Creatinine, Ser: 0.9 mg/dL (ref 0.61–1.24)
GFR, Estimated: >60 mL/min (ref >60)
Glucose, Bld: 77 mg/dL (ref 70–99)
Potassium: 3.8 mmol/L (ref 3.5–5.1)
Sodium: 140 mmol/L (ref 135–145)
Total Bilirubin: 0.7 mg/dL (ref 0.3–1.2)
Total Protein: 7.4 g/dL (ref 6.5–8.1)

## 2020-12-16 LAB — DIFFERENTIAL
Abs Immature Granulocytes: 0.03 10*3/uL (ref 0.00–0.07)
Basophils Absolute: 0.1 10*3/uL (ref 0.0–0.1)
Basophils Relative: 1 %
Eosinophils Absolute: 0.1 10*3/uL (ref 0.0–0.5)
Eosinophils Relative: 1 %
Immature Granulocytes: 0 %
Lymphocytes Relative: 25 %
Lymphs Abs: 2.2 10*3/uL (ref 0.7–4.0)
Monocytes Absolute: 0.7 10*3/uL (ref 0.1–1.0)
Monocytes Relative: 7 %
Neutro Abs: 5.9 10*3/uL (ref 1.7–7.7)
Neutrophils Relative %: 66 %

## 2020-12-16 LAB — PROTIME-INR
INR: 1 (ref 0.8–1.2)
Prothrombin Time: 13 seconds (ref 11.4–15.2)

## 2020-12-16 LAB — CBC
HCT: 46.5 % (ref 39.0–52.0)
Hemoglobin: 16 g/dL (ref 13.0–17.0)
MCH: 27.9 pg (ref 26.0–34.0)
MCHC: 34.4 g/dL (ref 30.0–36.0)
MCV: 81 fL (ref 80.0–100.0)
Platelets: 271 10*3/uL (ref 150–400)
RBC: 5.74 MIL/uL (ref 4.22–5.81)
RDW: 13.2 % (ref 11.5–15.5)
WBC: 8.9 10*3/uL (ref 4.0–10.5)
nRBC: 0 % (ref 0.0–0.2)

## 2020-12-16 LAB — APTT: aPTT: 28 seconds (ref 24–36)

## 2020-12-16 MED ORDER — SODIUM CHLORIDE 0.9% FLUSH
3.0000 mL | Freq: Once | INTRAVENOUS | Status: AC
Start: 2020-12-16 — End: 2020-12-16
  Administered 2020-12-16: 3 mL via INTRAVENOUS

## 2020-12-16 MED ORDER — IOHEXOL 350 MG/ML SOLN
75.0000 mL | Freq: Once | INTRAVENOUS | Status: AC | PRN
  Administered 2020-12-16: 75 mL via INTRAVENOUS

## 2020-12-16 NOTE — ED Provider Notes (Signed)
[facility] Emergency Department Provider Note   ____________________________________________   Event Date/Time   First MD Initiated Contact with Patient 12/16/20 1622     (approximate)  I have reviewed the triage vital signs and the nursing notes.   HISTORY  Chief Complaint Code Stroke  EM caveat, some limitation due to acuity, activated as a "code stroke"   HPI Luke ChurchKenneth D Huckaba Jr. is a 30 y.o. male with a history of a previous possible stroke  Activated as a possible acute stroke by EMS for concerns of acute left-sided weakness.  Patient reported that the symptoms came on abruptly, first noted while he was at work and seem to worsen.  Occurred roughly 3 PM while at work.  Symptoms seem to progress and he called EMS when he got home  Reports he felt weak on the left side of face left arm left leg  He does report a previous incident where he got TPA for stroke [facility] few years ago with the same.  However is also been told stress may be provoking these  Denies any recent new stressors  No chest pain no nausea no vomiting no recent illness fevers or chills.  Feeling in his normal health until this occurred   Past Medical History:  Diagnosis Date  . Anxiety   . [facility]    pt reports was "stress/anxiety event", not stroke  . GERD (gastroesophageal reflux disease)   . Hyperlipidemia     Patient Active Problem List   Diagnosis Date Noted  . Abnormal computed tomography of small intestine   . RLQ abdominal pain   . Rectal polyp   . Anxiety disorder, unspecified 05/15/2019  . Left-sided weakness 08/19/2017  . GERD (gastroesophageal reflux disease) 08/19/2017  . History of stroke 08/19/2017  . Tobacco use disorder 08/03/2017  . Acute ischemic stroke (HCC) 08/02/2017  . Abdominal pain 04/22/2014  . Diarrhea 04/22/2014    Past Surgical History:  Procedure Laterality Date  . BIOPSY N/A 05/22/2019   Procedure: BIOPSY;   Surgeon: Midge MiniumWohl, Darren, MD;  [facility];  Service: Endoscopy;  Laterality: N/A;  . COLONOSCOPY  2015  . COLONOSCOPY WITH PROPOFOL N/A 05/22/2019   Procedure: COLONOSCOPY WITH PROPOFOL;  Surgeon: Midge MiniumWohl, Darren, MD;  [facility];  Service: Endoscopy;  Laterality: N/A;  . POLYPECTOMY  05/22/2019   Procedure: POLYPECTOMY;  Surgeon: Midge MiniumWohl, Darren, MD;  [facility];  Service: Endoscopy;;    Prior to Admission medications   Medication Sig Start Date End Date Taking? Authorizing Provider  albuterol (VENTOLIN HFA) 108 (90 Base) MCG/ACT inhaler Inhale 2 puffs into the lungs every 6 (six) hours as needed for shortness of breath. 05/27/20   Dionne BucySiadecki, Sebastian, MD  cyclobenzaprine (FLEXERIL) 5 MG tablet Take 1 tablet (5 mg total) by mouth 3 (three) times daily as needed for muscle spasms (do not drive on this med). 07/28/18   Jeanmarie PlantMcShane, James A, MD  dicyclomine (BENTYL) 20 MG tablet Take 1 tablet (20 mg total) by mouth 3 (three) times daily as needed for spasms. 06/19/19 06/18/20  Jene EveryKinner, Robert, MD  escitalopram (LEXAPRO) 10 MG tablet Take by mouth. 12/13/18   [provider]  esomeprazole (NEXIUM) 20 MG capsule Take 20 mg by mouth daily at 12 noon.    [provider]  guaiFENesin (ROBITUSSIN) 100 MG/5ML SOLN Take 15 mLs (300 mg total) by mouth every 4 (four) hours as needed for cough or to loosen phlegm.  05/27/20   Dionne Bucy, MD  hyoscyamine (ANASPAZ) 0.125 MG TBDP disintergrating tablet Place 1 tablet (0.125 mg total) under the tongue every 6 (six) hours as needed for up to 5 days (abdominal pain). 05/27/20 06/01/20  Dionne Bucy, MD  naproxen (NAPROSYN) 500 MG tablet Take 1 tablet (500 mg total) by mouth 2 (two) times daily with a meal. 10/02/20   Sharman Cheek, MD  ondansetron (ZOFRAN ODT) 4 MG disintegrating tablet Take 1 tablet (4 mg total) by mouth every 8 (eight) hours as needed for nausea or vomiting. 10/02/20   Sharman Cheek, MD  promethazine (PHENERGAN) 25 MG tablet Take 1 tablet (25 mg total) by mouth every 8 (eight) hours as needed for nausea or vomiting. 08/23/19   Shaune Pollack, MD    Allergies Patient has no known allergies.  Family History  Problem Relation Age of Onset  . Diabetes Mother   . Heart attack Mother   . Crohn's disease Father   . Hypertension Father     Social History Social History   Tobacco Use  . Smoking status: Former Smoker    Packs/day: 0.50    Years: 10.00    Pack years: 5.00    Quit date: 07/27/2017    Years since quitting: 3.3  . Smokeless tobacco: Current User    Types: Snuff, Chew  . Tobacco comment: since age 74  Vaping Use  . Vaping Use: Never used  Substance Use Topics  . Alcohol use: Yes    Alcohol/week: 6.0 standard drinks    Types: 6 Cans of beer per week    Comment: 3-4 beers a week  . Drug use: No    Review of Systems Constitutional: No fever/chills Eyes: No visual changes. ENT: No sore throat. Cardiovascular: Denies chest pain. Respiratory: Denies shortness of breath. Gastrointestinal: No abdominal pain.   Musculoskeletal: Negative for pain or neck pain. Neurological: Negative for headaches.   Denies alcohol abuse ____________________________________________   PHYSICAL EXAM:  VITAL SIGNS: ED Triage Vitals  Enc Vitals Group     BP      Pulse      Resp      Temp      Temp src      SpO2      Weight      Height      Head Circumference      Peak Flow      Pain Score      Pain Loc      Pain Edu?      Excl. in GC?     Constitutional: Alert and oriented. Well appearing and in no acute distress. Eyes: Conjunctivae are normal. Head: Atraumatic. Nose: No congestion/rhinnorhea. Mouth/Throat: Mucous membranes are moist. Neck: No stridor.  Cardiovascular: Normal rate, regular rhythm. Grossly normal heart sounds.  Good peripheral circulation. Respiratory: Normal respiratory effort.  No retractions. Lungs  CTAB. Gastrointestinal: Soft and nontender. No distention. Musculoskeletal: No lower extremity tenderness nor edema. Neurologic:  Normal speech and language. No gross focal neurologic deficits are appreciated.  Skin:  Skin is warm, dry and intact. No rash noted. Psychiatric: Mood and affect are normal. Speech and behavior are normal.  ____________________________________________   LABS (all labs ordered are listed, but only abnormal results are displayed)  Labs Reviewed  COMPREHENSIVE METABOLIC PANEL - Abnormal; Notable for the following components:      Result Value   ALT 48 (*)    All other components within normal limits  PROTIME-INR  APTT  CBC  DIFFERENTIAL  I-STAT CREATININE, ED  CBG MONITORING, ED   ____________________________________________  EKG  ED ECG REPORT I, Sharyn Creamer, the attending physician, personally viewed and interpreted this ECG.  Date: 12/16/2020 EKG Time: 1725 Rate: 70 Rhythm: normal sinus rhythm QRS Axis: normal Intervals: normal ST/T Wave abnormalities: normal Narrative Interpretation: no evidence of acute ischemia, probably some mild early repolarization  ____________________________________________  RADIOLOGY  CT ANGIOGRAM HEAD NECK W WO CONTRAST  Result Date: 12/16/2020 CLINICAL DATA:  Neuro deficit acute stroek suspected. EXAM: CT ANGIOGRAPHY HEAD AND NECK TECHNIQUE: Multidetector CT imaging of the head and neck was performed using the standard protocol during bolus administration of intravenous contrast. Multiplanar CT image reconstructions and MIPs were obtained to evaluate the vascular anatomy. Carotid stenosis measurements (when applicable) are obtained utilizing NASCET criteria, using the distal internal carotid diameter as the denominator. CONTRAST:  39mL OMNIPAQUE IOHEXOL 350 MG/ML SOLN COMPARISON:  August 20, 2017. FINDINGS: CTA NECK FINDINGS Aortic arch: Imaged portion shows no evidence of aneurysm or dissection. No significant  stenosis of the major arch vessel origins. Right carotid system: No evidence of dissection, stenosis (50% or greater) or occlusion. Left carotid system: No evidence of dissection, stenosis (50% or greater) or occlusion. Vertebral arteries: Codominant. No evidence of dissection, stenosis (50% or greater) or occlusion. Skeleton: No acute abnormality. Other neck: No mass or suspicious adenopathy. Upper chest: Visualized lung apices are clear. Review of the MIP images confirms the above findings CTA HEAD FINDINGS Anterior circulation: No significant stenosis, proximal occlusion, or aneurysm. Similar dominant left A1. Posterior circulation: No significant stenosis, proximal occlusion, or aneurysm. 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 Venous sinuses: As permitted by contrast timing, patent. Review of the MIP images confirms the above findings IMPRESSION: No large vessel occlusion or hemodynamically significant proximal stenosis in the head or neck. No significant change in comparison to prior. Findings discussed with Dr. Fanny Bien via telephone at 4:48 PM. Electronically Signed   By: Feliberto Harts MD   On: 12/16/2020 16:50   MR BRAIN WO CONTRAST  Result Date: 12/16/2020 CLINICAL DATA:  Neuro deficit, acute stroke suspected. EXAM: MRI HEAD WITHOUT CONTRAST TECHNIQUE: Multiplanar, multiecho pulse sequences of the brain and surrounding structures were obtained without intravenous contrast. COMPARISON:  August 20, 2017. FINDINGS: Brain: No acute infarction, hemorrhage, hydrocephalus, extra-axial collection or mass lesion. Vascular: Major arterial flow voids are maintained at the skull base. Skull and upper cervical spine: Normal marrow signal. Sinuses/Orbits: Clear sinuses.  Unremarkable orbits. Other: No mastoid effusions. IMPRESSION: No evidence of acute intracranial abnormality.  No acute infarct. Electronically Signed   By: Feliberto Harts MD   On: 12/16/2020 17:13   CT  HEAD CODE STROKE WO CONTRAST  Addendum Date: 12/16/2020   ADDENDUM REPORT: 12/16/2020 16:52 ADDENDUM: Findings discussed with Dr. Fanny Bien via telephone at 4:48 PM. Electronically Signed   By: Feliberto Harts MD   On: 12/16/2020 16:52   Result Date: 12/16/2020 CLINICAL DATA:  Code stroke.  Left-sided weakness. EXAM: CT HEAD WITHOUT CONTRAST TECHNIQUE: Contiguous axial images were obtained from the base of the skull through the vertex without intravenous contrast. COMPARISON:  August 20, 2017. FINDINGS: Brain: No evidence of acute large vascular territory infarction, hemorrhage, hydrocephalus, extra-axial collection or mass lesion/mass effect. Vascular: No hyperdense vessel identified. Skull: No acute fracture. Sinuses/Orbits: Visualized sinuses are clear. Other: No mastoid effusions. ASPECTS [facility] Stroke Program Early CT Score) total score (0-10 with 10 being normal): 10. IMPRESSION: 1. No evidence of  acute intracranial abnormality. 2. ASPECTS is 10. Dr. Tempie Hoist paged at 4:36 PM.  Will convey results when called back. Electronically Signed: By: Feliberto Harts MD On: 12/16/2020 16:39     Imaging reviewed.  Discussed with radiologist, advises no evidence to support acute stroke on CT or CTA.  In addition MRI reviewed discussed with Dr. Iver Nestle which does not show acute abnormality ____________________________________________   PROCEDURES  Procedure(s) performed: None  Procedures  Critical Care performed: Yes, see critical care note(s)  CRITICAL CARE Performed by: Sharyn Creamer   Total critical care time: 30 minutes  Critical care time was exclusive of separately billable procedures and treating other patients.  Critical care was necessary to treat or prevent imminent or life-threatening deterioration.  Critical care was time spent personally by me on the following activities: development of treatment plan with patient and/or surrogate as well as nursing, discussions with consultants,  evaluation of patient's response to treatment, examination of patient, obtaining history from patient or surrogate, ordering and performing treatments and interventions, ordering and review of laboratory studies, ordering and review of radiographic studies, pulse oximetry and re-evaluation of patient's condition.  ____________________________________________   INITIAL IMPRESSION / ASSESSMENT AND PLAN / ED COURSE  Pertinent labs & imaging results that were available during my care of the patient were reviewed by me and considered in my medical decision making (see chart for details).   Patient preactivated a code stroke for acute left-sided hemiparesis.  Noted to have weakness in the left arm left leg.  This does appear to be acute in onset, high concern for possible acute stroke possible LVO.  No reported seizure-like activity.  No known history of seizures.  No associated neck pain or recent traumas or injuries.  Patient denies acute cardiac or pulmonary symptoms.  Clinical Course as of 12/16/20 1842  Mon Dec 16, 2020  1625 Notified Dr. Iver Nestle at this time and I have reviewed the patient's records from [facility] that seem to indicate he may have episodic hemiplegia or some other etiology for his acute deficits in the past.  Appears he has had TPA once but also has been worked up before with negative MRIs and concern that this may not be representative acute strokes when this has occurred previous [MQ]  1648 CT head negative for acute. [MQ]  1814 Patient continued to have left leg weakness and numbness.  Demonstrates use of the left upper extremity with some slight weakness.  Overall I suspect exam is slowly improving.  Currently neurology Dr. Iver Nestle reevaluating. [MQ]    Clinical Course User Index [MQ] Sharyn Creamer, MD    ----------------------------------------- 6:41 PM on 12/16/2020 -----------------------------------------  Dr. But got advised me patient has been ambulatory now  and feels likely ready to go home.  Discussed with the patient and he was able to get up and ambulate back and forth independently to the toilet without difficulty and his symptoms are much improved.  He demonstrates normal strength in all extremities.  Reports sensation improving.  At this point and with the recommendations of neurology considered, will discharge to follow-up closely with neurology.  Patient understand this plan and will follow up with either our neurology team or [facility] whom he is seen in the past.  Return precautions and treatment recommendations and follow-up discussed with the patient who is agreeable with the plan.  ____________________________________________   FINAL CLINICAL IMPRESSION(S) / ED DIAGNOSES  Final diagnoses:  Paresthesia        Note:  This document was prepared using Conservation officer, historic buildings and may include unintentional dictation errors       Sharyn Creamer, MD 12/16/20 1842

## 2020-12-16 NOTE — ED Triage Notes (Signed)
Patient arrived via EMS for left sided weakness that started around 3pm. Patient has a history of this and has been seen at [facility] for same.

## 2020-12-16 NOTE — Progress Notes (Signed)
CODE STROKE- PHARMACY COMMUNICATION   Time CODE STROKE called/page received: 1606  Time response to CODE STROKE was made (in person or via phone): 1606  Time Stroke Kit retrieved from [facility] (only if needed): 1606 (not given)  Name of Provider/Nurse contacted: Dr. Curly Shores; Jana Half, RN   Tawnya Crook ,PharmD Clinical Pharmacist  12/16/2020  5:01 PM

## 2020-12-16 NOTE — ED Notes (Signed)
Pt arrived to desk, CBG obtained, MD Quale for bedside assessment and pt taken to CT.

## 2020-12-16 NOTE — ED Notes (Signed)
Patient brought to room by Zollie Scale, RN Stroke Coordinator from CT/MRI.

## 2020-12-16 NOTE — Consult Note (Signed)
Neurology Consultation Reason for Consult: Left sided weakness Requesting Physician: Sharyn Creamer  CC: Left sided weakness   History is obtained from: Patient and chart review  HPI: Luke Conway. is a 30 y.o. male with a past medical history significant for intermittent left-sided weakness (previously treated with TPA), chronic abdominal pain, anxiety, hyperlipidemia, presenting with left-sided weakness.  He was at work when at 3 PM he began to feel dizzy and lightheaded.  He was brought to the ED by EMS for evaluation.  His initial blood pressure was in the 150s per EMS, dropping to 99/50 on repeat check.  It was 90-110s over 60s on arrival to the ED.  Given functional overlay on exam and repeated presentations with the same symptoms (this is now the third per accessible EMR), is taken to MRI to rule out stroke given he was in the TPA window  He denies headache, nausea, vomiting, vision changes, and trouble with his speech or swallow, or signs/symptoms of infection.  He denies any shaking, tongue biting, unexplained incontinence  Regarding his left-sided numbness, he reports that he did follow-up with [facility] after the first event, but then did not have any outpatient neurological follow-up after his second event.  He reports the first time his symptoms lasted about 20 hours, and the second time they lasted about 12 hours.  He has continued to have symptoms intermittently for up to 30 minutes at a time approximately once per month.  LKW: 3 PM tPA given?: No, MRI negative for stroke Premorbid modified rankin scale:      0 - No symptoms.  ROS: All other review of systems was negative except as noted in the HPI.  Past Medical History:  Diagnosis Date  . Anxiety   . CVA (cerebral vascular accident) [facility])    pt reports was "stress/anxiety event", not stroke  . GERD (gastroesophageal reflux disease)   . Hyperlipidemia    Past Surgical History:  Procedure Laterality Date  . BIOPSY N/A  05/22/2019   Procedure: BIOPSY;  Surgeon: Midge Minium, MD;  Location: [facility];  Service: Endoscopy;  Laterality: N/A;  . COLONOSCOPY  2015  . COLONOSCOPY WITH PROPOFOL N/A 05/22/2019   Procedure: COLONOSCOPY WITH PROPOFOL;  Surgeon: Midge Minium, MD;  Location: [facility];  Service: Endoscopy;  Laterality: N/A;  . POLYPECTOMY  05/22/2019   Procedure: POLYPECTOMY;  Surgeon: Midge Minium, MD;  Location: [facility];  Service: Endoscopy;;    Family History  Problem Relation Age of Onset  . Diabetes Mother   . Heart attack Mother   . Crohn's disease Father   . Hypertension Father    Social History:  reports that he quit smoking about 3 years ago. He has a 5.00 pack-year smoking history. His smokeless tobacco use includes snuff and chew. He reports current alcohol use of about 6.0 standard drinks of alcohol per week. He reports that he does not use drugs.  Exam: Current vital signs: BP 96/62   Pulse 68   Temp 98.1 F (36.7 C) (Oral)   Resp 19   Ht 5\' 7"  (1.702 m)   Wt 90.9 kg   SpO2 98%   BMI 31.37 kg/m  Vital signs in last 24 hours: Temp:  [98.1 F (36.7 C)] 98.1 F (36.7 C) (03/28 1712) Pulse Rate:  [68-78] 68 (03/28 1800) Resp:  [16-19] 19 (03/28 1800) BP: (93-110)/(60-62) 96/62 (03/28 1800) SpO2:  [98 %-100 %] 98 % (03/28 1800) Weight:  [90.9 kg] 90.9 kg (  03/28 1714)   Physical Exam  Constitutional: Appears well-developed and well-nourished.  Psych: Affect flat but cooperative and calm Eyes: No scleral injection HENT: No oropharyngeal obstruction.  MSK: no joint deformities.  Cardiovascular: Normal rate and regular rhythm.  Respiratory: Effort normal, non-labored breathing GI: Soft.  No distension. There is no tenderness.  Skin: Warm dry and intact visible skin  Neuro: Mental Status: Patient is awake, alert, oriented to person, place, month, year, and situation. Patient is able to give a clear and coherent history. No signs of aphasia  or neglect Cranial Nerves: II: Visual Fields are full. Pupils are equal, round, and reactive to light.   III,IV, VI: EOMI without ptosis or diploplia.  V: Facial sensation is symmetric to temperature VII: Facial movement is symmetric; on smile and puffing out his cheeks bilaterally, but symmetric when he is speaking casually.  VIII: hearing is intact to voice X: Uvula elevates symmetrically XI: Shoulder shrug is symmetric. XII: tongue is midline without atrophy or fasciculations.  Motor: Tone is notable for paratonia.  Bulk is normal.  There is significant functional overlay on examination, and that at times he will not even lift his left leg antigravity, but when he is assisting with removal of his pants he moves much more easily.  Effort is inconsistent overall. Sensory: Sensation is reduced at the left foot with minimal reaction to strong noxious stimulation, but also there is only slight reaction to the same noxious stimulation in the right foot Deep Tendon Reflexes: 2+ and symmetric in the patellae Plantars: Toes are mute bilaterally Cerebellar: FNF and HKS are intact on the right, participation limited on the left  NIHSS total 5 Score breakdown: 1 point for left arm weakness from 2 points for left leg weakness, 2 points for left sided sensory loss    I have reviewed labs in epic and the results pertinent to this consultation are: 0.9 Mild ALT elevation of 48, otherwise normal CMP Normal CBC Normal PT/INR  I have personally reviewed the images obtained: Head CT w/o acute intracranial process CTA without large vessel occlusion MRI brain normal  Impression: This is a 30 year old man with a past medical history significant for anxiety, recurrent abdominal pain, recurrent left-sided weakness and numbness.  Discussed with the patient that examination is limited in the setting of a functional overlay on his examination, but that he is not having any acute stroke and I expect his  symptoms will resolve as they have been in the past.  He may benefit from outpatient neurology follow-up to further evaluate for any peripheral nerve injury that may be underlying his recurrent left-sided symptoms  Recommendations: - CTA to rule out LVO, negative - MRI brain to rule out acute stroke, negative - Patient will arrange neurology follow-up for himself with prior [facility] neurologist or [facility] clinic - If he is unable to ambulate, may be admitted for observation and PT/OT evaluation, otherwise appropriate for discharge  Brooke Dare MD-PhD [facility] [phone] [facility] coverage for [facility] is from 8 AM to 4 AM in-house and 4 PM to 8 PM by telephone/video. 8 PM to 8 AM emergent questions or overnight urgent questions should be addressed to Teleneurology On-call or [facility] neurohospitalist; contact information can be found on AMION

## 2020-12-17 LAB — CBG MONITORING, ED: Glucose-Capillary: 88 mg/dL (ref 70–99)

## 2021-03-18 ENCOUNTER — Emergency Department (HOSPITAL_BASED_OUTPATIENT_CLINIC_OR_DEPARTMENT_OTHER): Payer: Self-pay | Admitting: Radiology

## 2021-03-18 ENCOUNTER — Other Ambulatory Visit: Payer: Self-pay

## 2021-03-18 ENCOUNTER — Emergency Department (HOSPITAL_BASED_OUTPATIENT_CLINIC_OR_DEPARTMENT_OTHER)
Admission: EM | Admit: 2021-03-18 | Discharge: 2021-03-18 | Disposition: A | Discharge status: Home / Self Care | Payer: Self-pay | Attending: Emergency Medicine | Admitting: Emergency Medicine

## 2021-03-18 ENCOUNTER — Emergency Department (HOSPITAL_BASED_OUTPATIENT_CLINIC_OR_DEPARTMENT_OTHER): Payer: Self-pay

## 2021-03-18 ENCOUNTER — Encounter (HOSPITAL_BASED_OUTPATIENT_CLINIC_OR_DEPARTMENT_OTHER): Payer: Self-pay | Admitting: Obstetrics and Gynecology

## 2021-03-18 DIAGNOSIS — R1011 Right upper quadrant pain: Secondary | ICD-10-CM

## 2021-03-18 DIAGNOSIS — R42 Dizziness and giddiness: Secondary | ICD-10-CM | POA: Insufficient documentation

## 2021-03-18 DIAGNOSIS — K219 Gastro-esophageal reflux disease without esophagitis: Secondary | ICD-10-CM | POA: Insufficient documentation

## 2021-03-18 DIAGNOSIS — R1031 Right lower quadrant pain: Secondary | ICD-10-CM | POA: Insufficient documentation

## 2021-03-18 DIAGNOSIS — Z87891 Personal history of nicotine dependence: Secondary | ICD-10-CM | POA: Insufficient documentation

## 2021-03-18 LAB — URINALYSIS, ROUTINE W REFLEX MICROSCOPIC
Bilirubin Urine: NEGATIVE
Glucose, UA: NEGATIVE mg/dL
Hgb urine dipstick: NEGATIVE
Ketones, ur: NEGATIVE mg/dL
Leukocytes,Ua: NEGATIVE
Nitrite: NEGATIVE
Specific Gravity, Urine: 1.029 (ref 1.005–1.030)
pH: 7 (ref 5.0–8.0)

## 2021-03-18 LAB — CBC
HCT: 45 % (ref 39.0–52.0)
Hemoglobin: 15.4 g/dL (ref 13.0–17.0)
MCH: 27.5 pg (ref 26.0–34.0)
MCHC: 34.2 g/dL (ref 30.0–36.0)
MCV: 80.4 fL (ref 80.0–100.0)
Platelets: 230 10*3/uL (ref 150–400)
RBC: 5.6 MIL/uL (ref 4.22–5.81)
RDW: 13.8 % (ref 11.5–15.5)
WBC: 11.5 10*3/uL — ABNORMAL HIGH (ref 4.0–10.5)
nRBC: 0 % (ref 0.0–0.2)

## 2021-03-18 LAB — COMPREHENSIVE METABOLIC PANEL
ALT: 53 U/L — ABNORMAL HIGH (ref 0–44)
AST: 26 U/L (ref 15–41)
Albumin: 4.6 g/dL (ref 3.5–5.0)
Alkaline Phosphatase: 103 U/L (ref 38–126)
Anion gap: 8 (ref 5–15)
BUN: 14 mg/dL (ref 6–20)
CO2: 25 mmol/L (ref 22–32)
Calcium: 9.7 mg/dL (ref 8.9–10.3)
Chloride: 109 mmol/L (ref 98–111)
Creatinine, Ser: 0.73 mg/dL (ref 0.61–1.24)
GFR, Estimated: >60 mL/min (ref >60)
Glucose, Bld: 100 mg/dL — ABNORMAL HIGH (ref 70–99)
Potassium: 4 mmol/L (ref 3.5–5.1)
Sodium: 142 mmol/L (ref 135–145)
Total Bilirubin: 0.5 mg/dL (ref 0.3–1.2)
Total Protein: 7.4 g/dL (ref 6.5–8.1)

## 2021-03-18 LAB — LIPASE, BLOOD: Lipase: 20 U/L (ref 11–51)

## 2021-03-18 LAB — D-DIMER, QUANTITATIVE: D-Dimer, Quant: <0.27 ug/mL-FEU (ref 0.00–0.50)

## 2021-03-18 MED ORDER — HYDROMORPHONE HCL 1 MG/ML IJ SOLN
1.0000 mg | Freq: Once | INTRAMUSCULAR | Status: AC
  Administered 2021-03-18: 1 mg via INTRAVENOUS
  Filled 2021-03-18: qty 1

## 2021-03-18 MED ORDER — ONDANSETRON HCL 4 MG/2ML IJ SOLN
4.0000 mg | Freq: Once | INTRAMUSCULAR | Status: AC
Start: 2021-03-18 — End: 2021-03-18
  Administered 2021-03-18: 4 mg via INTRAVENOUS
  Filled 2021-03-18: qty 2

## 2021-03-18 MED ORDER — IOHEXOL 300 MG/ML  SOLN
100.0000 mL | Freq: Once | INTRAMUSCULAR | Status: AC | PRN
  Administered 2021-03-18: 100 mL via INTRAVENOUS

## 2021-03-18 MED ORDER — IOHEXOL 9 MG/ML PO SOLN
500.0000 mL | Freq: Once | ORAL | Status: AC
  Administered 2021-03-18: 500 mL via ORAL

## 2021-03-18 NOTE — Discharge Instructions (Addendum)
Would recommend trial of Motrin for about 7 days.  Work note provided so that she can rest tomorrow.  No acute findings on today's work-up.  Return for any new or worse symptoms.

## 2021-03-18 NOTE — ED Provider Notes (Signed)
Patient's work-up for the abdominal pain without any acute findings on CT scan.  Chest x-ray also negative.  Since there was some shortness of breath or pleuritic type chest pain associated with it D-dimer was done and is normal.  Patient stable for discharge home follow-up with primary care doctor.  Recommend a trial of Motrin for 7 days.  Work note provided.   Vanetta Mulders, MD 03/18/21 [phone]

## 2021-03-18 NOTE — ED Triage Notes (Signed)
Patient reports right sided abdominal pain from RLQ to RUQ. Patient reports when he breathes it hurts his stomach to take a deep breath. Denies N/V/D but endorses some dizziness. Patient states pain started x1 hour ago.

## 2021-03-18 NOTE — ED Provider Notes (Signed)
[facility] Provider Note   CSN: [id number] Arrival date & time: 03/18/21  1117     History Chief Complaint  Patient presents with   Abdominal Pain    Luke ChurchKenneth D Pollack Jr. is a 30 y.o. male.  Luke ChurchKenneth D Dessert Jr. is very active.  He does a lot of activities with his kids and was wrestling with some people over the weekend.  He thought may be some right upper quadrant abdominal pain he developed was related to his physical activities.  These do not differ from his usual.  However, today, about an hour ago, symptoms worsened dramatically.  He is now having severe right-sided abdominal pain.  Pain was so severe that he felt lightheaded.  The history is provided by the patient.  Abdominal Pain Pain location:  RLQ Pain quality: sharp and stabbing   Pain radiates to:  Does not radiate Pain severity:  Severe Onset quality:  Gradual Duration:  1 day Timing:  Constant Progression:  Worsening Chronicity:  New Context: not diet changes, not recent illness, not sick contacts, not suspicious food intake and not trauma   Relieved by:  Nothing Worsened by:  Movement and deep breathing Ineffective treatments:  None tried Associated symptoms: no anorexia, no chest pain, no chills, no constipation, no cough, no diarrhea, no dysuria, no fever, no hematuria, no nausea, no shortness of breath, no sore throat and no vomiting       Past Medical History:  Diagnosis Date   Anxiety    CVA (cerebral vascular accident) (HCC)    pt reports was "stress/anxiety event", not stroke   GERD (gastroesophageal reflux disease)    Hyperlipidemia     Patient Active Problem List   Diagnosis Date Noted   Abnormal computed tomography of small intestine    RLQ abdominal pain    Rectal polyp    Anxiety disorder, unspecified 05/15/2019   Left-sided weakness 08/19/2017   GERD (gastroesophageal reflux disease) 08/19/2017   History of stroke 08/19/2017   Tobacco use disorder 08/03/2017    Acute ischemic stroke (HCC) 08/02/2017   Abdominal pain 04/22/2014   Diarrhea 04/22/2014    Past Surgical History:  Procedure Laterality Date   BIOPSY N/A 05/22/2019   Procedure: BIOPSY;  Surgeon: Midge MiniumWohl, Darren, MD;  [facility];  Service: Endoscopy;  Laterality: N/A;   COLONOSCOPY  2015   COLONOSCOPY WITH PROPOFOL N/A 05/22/2019   Procedure: COLONOSCOPY WITH PROPOFOL;  Surgeon: Midge MiniumWohl, Darren, MD;  [facility];  Service: Endoscopy;  Laterality: N/A;   POLYPECTOMY  05/22/2019   Procedure: POLYPECTOMY;  Surgeon: Midge MiniumWohl, Darren, MD;  [facility];  Service: Endoscopy;;       Family History  Problem Relation Age of Onset   Diabetes Mother    Heart attack Mother    Crohn's disease Father    Hypertension Father     Social History   Tobacco Use   Smoking status: Former    Packs/day: 0.50    Years: 10.00    Pack years: 5.00    Types: Cigarettes    Quit date: 07/27/2017    Years since quitting: 3.6   Smokeless tobacco: Current    Types: Snuff, Chew   Tobacco comments:    since age 30  Vaping Use   Vaping Use: Some days   Substances: Nicotine, Flavoring  Substance Use Topics   Alcohol use: Yes    Alcohol/week: 6.0 standard drinks    Types: 6 Cans of  beer per week    Comment: 3-4 beers a week   Drug use: No    Home Medications Prior to Admission medications   Medication Sig Start Date End Date Taking? Authorizing Provider  albuterol (VENTOLIN HFA) 108 (90 Base) MCG/ACT inhaler Inhale 2 puffs into the lungs every 6 (six) hours as needed for shortness of breath. 05/27/20   Dionne Bucy, MD  cyclobenzaprine (FLEXERIL) 5 MG tablet Take 1 tablet (5 mg total) by mouth 3 (three) times daily as needed for muscle spasms (do not drive on this med). 07/28/18   Jeanmarie Plant, MD  dicyclomine (BENTYL) 20 MG tablet Take 1 tablet (20 mg total) by mouth 3 (three) times daily as needed for spasms. 06/19/19 06/18/20  Jene Every, MD   escitalopram (LEXAPRO) 10 MG tablet Take by mouth. 12/13/18   [provider]  esomeprazole (NEXIUM) 20 MG capsule Take 20 mg by mouth daily at 12 noon.    [provider]  guaiFENesin (ROBITUSSIN) 100 MG/5ML SOLN Take 15 mLs (300 mg total) by mouth every 4 (four) hours as needed for cough or to loosen phlegm. 05/27/20   Dionne Bucy, MD  hyoscyamine (ANASPAZ) 0.125 MG TBDP disintergrating tablet Place 1 tablet (0.125 mg total) under the tongue every 6 (six) hours as needed for up to 5 days (abdominal pain). 05/27/20 06/01/20  Dionne Bucy, MD  naproxen (NAPROSYN) 500 MG tablet Take 1 tablet (500 mg total) by mouth 2 (two) times daily with a meal. 10/02/20   Sharman Cheek, MD  ondansetron (ZOFRAN ODT) 4 MG disintegrating tablet Take 1 tablet (4 mg total) by mouth every 8 (eight) hours as needed for nausea or vomiting. 10/02/20   Sharman Cheek, MD  promethazine (PHENERGAN) 25 MG tablet Take 1 tablet (25 mg total) by mouth every 8 (eight) hours as needed for nausea or vomiting. 08/23/19   Shaune Pollack, MD    Allergies    Patient has no known allergies.  Review of Systems   Review of Systems  Constitutional:  Negative for chills and fever.  HENT:  Negative for ear pain and sore throat.   Eyes:  Negative for pain and visual disturbance.  Respiratory:  Negative for cough and shortness of breath.   Cardiovascular:  Negative for chest pain and palpitations.  Gastrointestinal:  Positive for abdominal pain. Negative for anorexia, constipation, diarrhea, nausea and vomiting.  Genitourinary:  Negative for dysuria and hematuria.  Musculoskeletal:  Negative for arthralgias and back pain.  Skin:  Negative for color change and rash.  Neurological:  Positive for light-headedness. Negative for seizures and syncope.  All other systems reviewed and are negative.  Physical Exam Updated Vital Signs BP 121/85 (BP Location: Right Arm)   Pulse 72   Temp 97.8 F (36.6 C)  (Oral)   Resp 12   Ht 5\' 7"  (1.702 m)   Wt 86.2 kg   SpO2 100%   BMI 29.76 kg/m   Physical Exam Vitals and nursing note reviewed.  Constitutional:      Appearance: Normal appearance.  HENT:     Head: Normocephalic and atraumatic.  Cardiovascular:     Rate and Rhythm: Normal rate and regular rhythm.     Heart sounds: Normal heart sounds.  Pulmonary:     Effort: Pulmonary effort is normal.     Breath sounds: Normal breath sounds.  Abdominal:     General: There is no distension.     Tenderness: There is abdominal tenderness in the right upper  quadrant and right lower quadrant. There is guarding.  Musculoskeletal:     Cervical back: Normal range of motion.     Right lower leg: No edema.     Left lower leg: No edema.  Skin:    General: Skin is warm and dry.  Neurological:     General: No focal deficit present.     Mental Status: He is alert and oriented to person, place, and time.  Psychiatric:        Mood and Affect: Mood normal.        Behavior: Behavior normal.    ED Results / Procedures / Treatments   Labs (all labs ordered are listed, but only abnormal results are displayed) Labs Reviewed  COMPREHENSIVE METABOLIC PANEL - Abnormal; Notable for the following components:      Result Value   Glucose, Bld 100 (*)    ALT 53 (*)    All other components within normal limits  CBC - Abnormal; Notable for the following components:   WBC 11.5 (*)    All other components within normal limits  URINALYSIS, ROUTINE W REFLEX MICROSCOPIC - Abnormal; Notable for the following components:   Protein, ur TRACE (*)    All other components within normal limits  LIPASE, BLOOD  D-DIMER, QUANTITATIVE    EKG None  Radiology CT Abdomen Pelvis W Contrast  Result Date: 03/18/2021 CLINICAL DATA:  Abdominal pain, primarily right cited EXAM: CT ABDOMEN AND PELVIS WITH CONTRAST TECHNIQUE: Multidetector CT imaging of the abdomen and pelvis was performed using the standard protocol following  bolus administration of intravenous contrast. Oral contrast also administered. CONTRAST:  OMNIPAQUE IOHEXOL 300 MG/ML  SOLN COMPARISON:  April 16, 2019 FINDINGS: Lower chest: There is slight atelectatic change in the lung bases. There is no lung base edema or consolidation. There is a 1 mm apparent nodular opacity in the lateral aspect of the right middle lobe seen on axial slice 1 series 4. Hepatobiliary: There is a 3 mm cyst in left lobe of the liver. No other liver lesions evident. Gallbladder wall is not appreciably thickened. There is no biliary duct dilatation. Pancreas: There is no appreciable pancreatic mass or inflammatory focus. Spleen: Splenic lesions are evident. Adrenals/Urinary Tract: Adrenals bilaterally appear normal. There is no appreciable renal mass or hydronephrosis on either side. There is no evident renal or ureteral calculus on either side. Urinary bladder is midline with wall thickness within normal limits. Stomach/Bowel: No appreciable diverticular disease. There is no appreciable bowel wall thickening. No bowel obstruction. The terminal ileum appears unremarkable. The appendix is not convincingly seen. No periappendiceal region inflammation is demonstrable on this study. No evident free air or portal venous air. Vascular/Lymphatic: There is no abdominal aortic aneurysm. No arterial vascular lesions are evident. Major venous structures appear patent. No evident adenopathy in the abdomen or pelvis. Reproductive: Prostate and seminal vesicles normal in size and contour. Other: There is fat in the left inguinal ring. No evident bowel containing hernia. No abscess or ascites in the abdomen or pelvis. Musculoskeletal: No blastic or lytic bone lesions. No intramuscular lesions evident. IMPRESSION: 1. No appreciable bowel wall thickening or bowel obstruction. No appreciable diverticular disease. No periappendiceal region inflammation. No abscess in the abdomen or pelvis. 2. No renal or  ureteral calculi. No hydronephrosis. Urinary bladder wall thickness within normal limits. Electronically Signed   By: Bretta Bang III M.D.   On: 03/18/2021 14:53    Procedures Procedures   Medications Ordered in ED Medications  HYDROmorphone (DILAUDID) injection 1 mg (has no administration in time range)  ondansetron (ZOFRAN) injection 4 mg (has no administration in time range)    ED Course  I have reviewed the triage vital signs and the nursing notes.  Pertinent labs & imaging results that were available during my care of the patient were reviewed by me and considered in my medical decision making (see chart for details).    MDM Rules/Calculators/A&P                          Luke Church. presented with right upper quadrant abdominal pain.  He was evaluated for evidence of biliary pathology.  Because he also had right lower quadrant pain, I wanted to make sure he did not have appendicitis.  CT was my initial imaging modality of choice.  This scan came back without significant source of pain.  When I spoke to him about these results, he was still endorsing quite a bit of pain.  Pain seems to also be pleuritic.  At this point, I decided we should evaluate him for any pulmonary pathology such as pneumonia, pneumothorax, PE.  He seems to be in too much pain to except a negative CT as a definitive exclusion of emergent conditions. Final Clinical Impression(s) / ED Diagnoses Final diagnoses:  Right upper quadrant abdominal pain    Rx / DC Orders ED Discharge Orders     None        Koleen Distance, MD 03/18/21 1642

## 2021-04-29 DIAGNOSIS — R55 Syncope and collapse: Secondary | ICD-10-CM

## 2022-08-24 ENCOUNTER — Encounter: Payer: Self-pay | Admitting: Emergency Medicine

## 2022-08-24 ENCOUNTER — Emergency Department
Admission: EM | Admit: 2022-08-24 | Discharge: 2022-08-24 | Disposition: A | Discharge status: Home / Self Care | Payer: Self-pay | Attending: Emergency Medicine | Admitting: Emergency Medicine

## 2022-08-24 ENCOUNTER — Other Ambulatory Visit: Payer: Self-pay

## 2022-08-24 DIAGNOSIS — J101 Influenza due to other identified influenza virus with other respiratory manifestations: Secondary | ICD-10-CM | POA: Insufficient documentation

## 2022-08-24 DIAGNOSIS — Z20822 Contact with and (suspected) exposure to covid-19: Secondary | ICD-10-CM | POA: Insufficient documentation

## 2022-08-24 LAB — RESP PANEL BY RT-PCR (FLU A&B, COVID) ARPGX2
Influenza A by PCR: POSITIVE — AB
Influenza B by PCR: NEGATIVE
SARS Coronavirus 2 by RT PCR: NEGATIVE

## 2022-08-24 NOTE — ED Provider Notes (Signed)
[facility] Provider Note    Event Date/Time   First MD Initiated Contact with Patient 08/24/22 1030     (approximate)   History   No chief complaint on file.   HPI  Hyrum Shaneyfelt. is a 31 y.o. male past medical history of GERD hyperlipidemia who presents with cough body aches.  Started with nasal congestion about a week ago and cough.  Cough worsened on Saturday, 2 days ago.  Woke up yesterday morning with severe body aches and had fever of 102.  Denies shortness of breath denies nausea vomiting diarrhea.  Still tolerating p.o.     Past Medical History:  Diagnosis Date   Anxiety    CVA (cerebral vascular accident) (HCC)    pt reports was "stress/anxiety event", not stroke   GERD (gastroesophageal reflux disease)    Hyperlipidemia     Patient Active Problem List   Diagnosis Date Noted   Abnormal computed tomography of small intestine    RLQ abdominal pain    Rectal polyp    Anxiety disorder, unspecified 05/15/2019   Left-sided weakness 08/19/2017   GERD (gastroesophageal reflux disease) 08/19/2017   History of stroke 08/19/2017   Tobacco use disorder 08/03/2017   Acute ischemic stroke (HCC) 08/02/2017   Abdominal pain 04/22/2014   Diarrhea 04/22/2014     Physical Exam  Triage Vital Signs: ED Triage Vitals  Enc Vitals Group     BP 08/24/22 0930 117/77     Pulse Rate 08/24/22 0930 88     Resp 08/24/22 0930 18     Temp 08/24/22 0930 99 F (37.2 C)     Temp Source 08/24/22 0930 Oral     SpO2 08/24/22 0930 95 %     Weight 08/24/22 0928 190 lb 0.6 oz (86.2 kg)     Height 08/24/22 0928 5\' 7"  (1.702 m)     Head Circumference --      Peak Flow --      Pain Score 08/24/22 0928 0     Pain Loc --      Pain Edu? --      Excl. in GC? --     Most recent vital signs: Vitals:   08/24/22 0930  BP: 117/77  Pulse: 88  Resp: 18  Temp: 99 F (37.2 C)  SpO2: 95%     General: Awake, no distress.  CV:  Good peripheral perfusion.   Resp:  Normal effort.  Lungs are clear Abd:  No distention.  Neuro:             Awake, Alert, Oriented x 3  Other:     ED Results / Procedures / Treatments  Labs (all labs ordered are listed, but only abnormal results are displayed) Labs Reviewed  RESP PANEL BY RT-PCR (FLU A&B, COVID) ARPGX2 - Abnormal; Notable for the following components:      Result Value   Influenza A by PCR POSITIVE (*)    All other components within normal limits     EKG     RADIOLOGY    PROCEDURES:  Critical Care performed: No  Procedures  MEDICATIONS ORDERED IN ED: Medications - No data to display   IMPRESSION / MDM / ASSESSMENT AND PLAN / ED COURSE  I reviewed the triage vital signs and the nursing notes.  Patient's presentation is most consistent with acute, uncomplicated illness.  Differential diagnosis includes, but is not limited to, viral illness including COVID-19, influenza, pneumonia  Patient is a 31 year old male who presents with cough congestion fever body aches.  Started having cough and congestion about a week ago and then developed severe body aches and fever 2 days ago.  His vital signs are reassuring he looks well lungs are clear and he has no dyspnea.  Influenza test is notably positive for influenza A.  This fits with his clinical picture.  Doubt pneumonia given no hypoxia no dyspnea.  Given he is otherwise healthy do not recommend Tamiflu.  Discussed supportive measures.  Work note provided.       FINAL CLINICAL IMPRESSION(S) / ED DIAGNOSES   Final diagnoses:  Influenza A     Rx / DC Orders   ED Discharge Orders     None        Note:  This document was prepared using Dragon voice recognition software and may include unintentional dictation errors.   Rada Hay, MD 08/24/22 1115

## 2022-08-24 NOTE — ED Triage Notes (Signed)
Cough x 1 week.  Body aches.  Fever over night.  Ibuprofen taken at 0700.

## 2022-08-24 NOTE — Discharge Instructions (Addendum)
You have influenza A.  You should stay out of work until you have been fever free for 24 hours.

## 2022-08-24 NOTE — ED Provider Triage Note (Signed)
Emergency Medicine Provider Triage Evaluation Note  Luke Conway. , a 31 y.o. male  was evaluated in triage.  Pt complains of cough x 1 week.  Former smoker.   Review of Systems  Positive: + body aches, fever.  Negative: No N/V/D  Physical Exam  BP 117/77 (BP Location: Left Arm)   Pulse 88   Temp 99 F (37.2 C) (Oral)   Resp 18   Ht 5\' 7"  (1.702 m)   Wt 86.2 kg   SpO2 95%   BMI 29.76 kg/m  Gen:   Awake, no distress   Resp:  Normal effort   cough+  MSK:   Moves extremities without difficulty  Other:    Medical Decision Making  Medically screening exam initiated at 9:34 AM.  Appropriate orders placed.  . was informed that the remainder of the evaluation will be completed by another provider, this initial triage assessment does not replace that evaluation, and the importance of remaining in the ED until their evaluation is complete.     Ray Church, PA-C 08/24/22 [phone]

## 2022-09-14 ENCOUNTER — Emergency Department: Payer: Self-pay

## 2022-09-14 ENCOUNTER — Other Ambulatory Visit: Payer: Self-pay

## 2022-09-14 DIAGNOSIS — F1290 Cannabis use, unspecified, uncomplicated: Secondary | ICD-10-CM | POA: Insufficient documentation

## 2022-09-14 DIAGNOSIS — R4182 Altered mental status, unspecified: Secondary | ICD-10-CM | POA: Insufficient documentation

## 2022-09-14 DIAGNOSIS — Z1152 Encounter for screening for COVID-19: Secondary | ICD-10-CM | POA: Insufficient documentation

## 2022-09-14 DIAGNOSIS — R000 Tachycardia, unspecified: Secondary | ICD-10-CM | POA: Insufficient documentation

## 2022-09-14 LAB — COMPREHENSIVE METABOLIC PANEL
ALT: 49 U/L — ABNORMAL HIGH (ref 0–44)
AST: 26 U/L (ref 15–41)
Albumin: 4.2 g/dL (ref 3.5–5.0)
Alkaline Phosphatase: 88 U/L (ref 38–126)
Anion gap: 5 (ref 5–15)
BUN: 17 mg/dL (ref 6–20)
CO2: 23 mmol/L (ref 22–32)
Calcium: 8.9 mg/dL (ref 8.9–10.3)
Chloride: 112 mmol/L — ABNORMAL HIGH (ref 98–111)
Creatinine, Ser: 0.81 mg/dL (ref 0.61–1.24)
GFR, Estimated: >60 mL/min (ref >60)
Glucose, Bld: 126 mg/dL — ABNORMAL HIGH (ref 70–99)
Potassium: 3.8 mmol/L (ref 3.5–5.1)
Sodium: 140 mmol/L (ref 135–145)
Total Bilirubin: 0.7 mg/dL (ref 0.3–1.2)
Total Protein: 7.4 g/dL (ref 6.5–8.1)

## 2022-09-14 LAB — CBC WITH DIFFERENTIAL/PLATELET
Abs Immature Granulocytes: 0.02 10*3/uL (ref 0.00–0.07)
Basophils Absolute: 0.1 10*3/uL (ref 0.0–0.1)
Basophils Relative: 1 %
Eosinophils Absolute: 0.3 10*3/uL (ref 0.0–0.5)
Eosinophils Relative: 3 %
HCT: 43.8 % (ref 39.0–52.0)
Hemoglobin: 14.6 g/dL (ref 13.0–17.0)
Immature Granulocytes: 0 %
Lymphocytes Relative: 36 %
Lymphs Abs: 3.1 10*3/uL (ref 0.7–4.0)
MCH: 27 pg (ref 26.0–34.0)
MCHC: 33.3 g/dL (ref 30.0–36.0)
MCV: 81 fL (ref 80.0–100.0)
Monocytes Absolute: 0.6 10*3/uL (ref 0.1–1.0)
Monocytes Relative: 7 %
Neutro Abs: 4.6 10*3/uL (ref 1.7–7.7)
Neutrophils Relative %: 53 %
Platelets: 283 10*3/uL (ref 150–400)
RBC: 5.41 MIL/uL (ref 4.22–5.81)
RDW: 13.2 % (ref 11.5–15.5)
WBC: 8.6 10*3/uL (ref 4.0–10.5)
nRBC: 0 % (ref 0.0–0.2)

## 2022-09-14 LAB — CBG MONITORING, ED: Glucose-Capillary: 132 mg/dL — ABNORMAL HIGH (ref 70–99)

## 2022-09-14 LAB — PROTIME-INR
INR: 1 (ref 0.8–1.2)
Prothrombin Time: 13.3 seconds (ref 11.4–15.2)

## 2022-09-14 LAB — ACETAMINOPHEN LEVEL: Acetaminophen (Tylenol), Serum: <10 ug/mL — ABNORMAL LOW (ref 10–30)

## 2022-09-14 LAB — APTT: aPTT: 24 seconds (ref 24–36)

## 2022-09-14 LAB — LACTIC ACID, PLASMA: Lactic Acid, Venous: 1.4 mmol/L (ref 0.5–1.9)

## 2022-09-14 LAB — RESP PANEL BY RT-PCR (RSV, FLU A&B, COVID)  RVPGX2
Influenza A by PCR: NEGATIVE
Influenza B by PCR: NEGATIVE
Resp Syncytial Virus by PCR: NEGATIVE
SARS Coronavirus 2 by RT PCR: NEGATIVE

## 2022-09-14 LAB — SALICYLATE LEVEL: Salicylate Lvl: <7 mg/dL — ABNORMAL LOW (ref 7.0–30.0)

## 2022-09-14 LAB — ETHANOL: Alcohol, Ethyl (B): <10 mg/dL (ref <10)

## 2022-09-14 LAB — TROPONIN I (HIGH SENSITIVITY): Troponin I (High Sensitivity): 2 ng/L (ref <18)

## 2022-09-14 NOTE — ED Notes (Signed)
Pt placed on cardiac monitor, st noted. Rate 110

## 2022-09-14 NOTE — ED Notes (Signed)
Per ems pt with ingestion of "mushroom gummies" per ems pt with tachycardia, rate in 112. "Normal EKG" per ems. Blood pressure and sats stable pe rems.

## 2022-09-14 NOTE — ED Triage Notes (Signed)
Pt arrives via [facility] with CC of altered mental status. EMS reports "mushroom gummy" ingestion. Pt confused but able to report name and birthday after extended direction.

## 2022-09-14 NOTE — ED Provider Triage Note (Signed)
Emergency Medicine Provider Triage Evaluation Note  Luke Conway. , a 31 y.o. male  was evaluated in triage.  Pt complains of AMS, patient can not provide HX. GCS 10. Possible reported ingestion. HX ischemic CVA.  Review of Systems  Positive: AMS Negative:   Physical Exam  BP 109/71 (BP Location: Left Arm)   Pulse (!) 112   Temp 98.8 F (37.1 C) (Oral)   Resp (!) 23   Ht 5\' 7"  (1.702 m)   Wt 86.2 kg   SpO2 94%   BMI 29.76 kg/m  Gen:   Awake, GCS 10 (eye open, withdraws pain, incomprehensible sounds) Resp:  Normal effort  MSK:   Withdraws pain. Not moving extremities otherwise Other:  AMS, unable to answer questions  Medical Decision Making  Medically screening exam initiated at 9:59 PM.  Appropriate orders placed.  . was informed that the remainder of the evaluation will be completed by another provider, this initial triage assessment does not replace that evaluation, and the importance of remaining in the ED until their evaluation is complete.  AMS. Can not provide HX. Possible reported ingestion. HX CVA. LAbs, imaging, UDS, ct head, xray   Ray Church, PA-C 09/14/22 2159

## 2022-09-15 ENCOUNTER — Emergency Department
Admission: EM | Admit: 2022-09-15 | Discharge: 2022-09-15 | Disposition: A | Discharge status: Home / Self Care | Payer: Self-pay | Attending: Emergency Medicine | Admitting: Emergency Medicine

## 2022-09-15 DIAGNOSIS — F1290 Cannabis use, unspecified, uncomplicated: Secondary | ICD-10-CM

## 2022-09-15 DIAGNOSIS — R4182 Altered mental status, unspecified: Secondary | ICD-10-CM

## 2022-09-15 LAB — URINE DRUG SCREEN, QUALITATIVE (ARMC ONLY)
Amphetamines, Ur Screen: NOT DETECTED
Barbiturates, Ur Screen: NOT DETECTED
Benzodiazepine, Ur Scrn: NOT DETECTED
Cannabinoid 50 Ng, Ur ~~LOC~~: POSITIVE — AB
Cocaine Metabolite,Ur ~~LOC~~: NOT DETECTED
MDMA (Ecstasy)Ur Screen: NOT DETECTED
Methadone Scn, Ur: NOT DETECTED
Opiate, Ur Screen: NOT DETECTED
Phencyclidine (PCP) Ur S: NOT DETECTED
Tricyclic, Ur Screen: NOT DETECTED

## 2022-09-15 LAB — URINALYSIS, ROUTINE W REFLEX MICROSCOPIC
Bacteria, UA: NONE SEEN
Bilirubin Urine: NEGATIVE
Glucose, UA: NEGATIVE mg/dL
Hgb urine dipstick: NEGATIVE
Ketones, ur: NEGATIVE mg/dL
Leukocytes,Ua: NEGATIVE
Nitrite: NEGATIVE
Protein, ur: 30 mg/dL — AB
Specific Gravity, Urine: 1.031 — ABNORMAL HIGH (ref 1.005–1.030)
pH: 6 (ref 5.0–8.0)

## 2022-09-15 MED ORDER — SODIUM CHLORIDE 0.9 % IV BOLUS
1000.0000 mL | Freq: Once | INTRAVENOUS | Status: AC
  Administered 2022-09-15: 1000 mL via INTRAVENOUS

## 2022-09-15 NOTE — ED Notes (Signed)
Pt alert, sitting in wheelchair in triage in no acute distress.

## 2022-09-15 NOTE — Discharge Instructions (Signed)
Do not use recreational drugs.  Return to the ER for worsening symptoms, persistent vomiting, lethargy, difficulty breathing or other concerns.

## 2022-09-15 NOTE — ED Provider Notes (Signed)
[facility] Provider Note    Event Date/Time   First MD Initiated Contact with Patient 09/15/22 [phone]     (approximate)   History   Altered Mental Status   HPI  Level V caveat: Limited by somnolence  Luke ChurchKenneth D Howes Jr. is a 31 y.o. male brought to the ED via EMS from home with a chief complaint of altered mentation.  Patient endorses eating two edibles.  Denies trauma or head injury.  Rest of history is limited secondary to somnolence.     Past Medical History   Past Medical History:  Diagnosis Date   Anxiety    CVA (cerebral vascular accident) (HCC)    pt reports was "stress/anxiety event", not stroke   GERD (gastroesophageal reflux disease)    Hyperlipidemia      Active Problem List   Patient Active Problem List   Diagnosis Date Noted   Abnormal computed tomography of small intestine    RLQ abdominal pain    Rectal polyp    Anxiety disorder, unspecified 05/15/2019   Left-sided weakness 08/19/2017   GERD (gastroesophageal reflux disease) 08/19/2017   History of stroke 08/19/2017   Tobacco use disorder 08/03/2017   Acute ischemic stroke (HCC) 08/02/2017   Abdominal pain 04/22/2014   Diarrhea 04/22/2014     Past Surgical History   Past Surgical History:  Procedure Laterality Date   BIOPSY N/A 05/22/2019   Procedure: BIOPSY;  Surgeon: Midge MiniumWohl, Darren, MD;  [facility];  Service: Endoscopy;  Laterality: N/A;   COLONOSCOPY  2015   COLONOSCOPY WITH PROPOFOL N/A 05/22/2019   Procedure: COLONOSCOPY WITH PROPOFOL;  Surgeon: Midge MiniumWohl, Darren, MD;  [facility];  Service: Endoscopy;  Laterality: N/A;   POLYPECTOMY  05/22/2019   Procedure: POLYPECTOMY;  Surgeon: Midge MiniumWohl, Darren, MD;  [facility];  Service: Endoscopy;;     Home Medications   Prior to Admission medications   Medication Sig Start Date End Date Taking? Authorizing Provider  albuterol (VENTOLIN HFA) 108 (90 Base) MCG/ACT inhaler  Inhale 2 puffs into the lungs every 6 (six) hours as needed for shortness of breath. Patient not taking: No sig reported 05/27/20   Dionne BucySiadecki, Sebastian, MD  cyclobenzaprine (FLEXERIL) 5 MG tablet Take 1 tablet (5 mg total) by mouth 3 (three) times daily as needed for muscle spasms (do not drive on this med). 07/28/18   Jeanmarie PlantMcShane, James A, MD  dicyclomine (BENTYL) 20 MG tablet Take 1 tablet (20 mg total) by mouth 3 (three) times daily as needed for spasms. 06/19/19 06/18/20  Jene EveryKinner, Robert, MD  escitalopram (LEXAPRO) 10 MG tablet Take by mouth. 12/13/18   [provider]  esomeprazole (NEXIUM) 20 MG capsule Take 20 mg by mouth daily at 12 noon.    [provider]  guaiFENesin (ROBITUSSIN) 100 MG/5ML SOLN Take 15 mLs (300 mg total) by mouth every 4 (four) hours as needed for cough or to loosen phlegm. Patient not taking: No sig reported 05/27/20   Dionne BucySiadecki, Sebastian, MD  hyoscyamine (ANASPAZ) 0.125 MG TBDP disintergrating tablet Place 1 tablet (0.125 mg total) under the tongue every 6 (six) hours as needed for up to 5 days (abdominal pain). 05/27/20 06/01/20  Dionne BucySiadecki, Sebastian, MD  naproxen (NAPROSYN) 500 MG tablet Take 1 tablet (500 mg total) by mouth 2 (two) times daily with a meal. Patient not taking: Reported on 03/18/2021 10/02/20   Sharman CheekStafford, Phillip, MD  ondansetron (ZOFRAN ODT) 4 MG disintegrating tablet Take 1 tablet (4 mg  total) by mouth every 8 (eight) hours as needed for nausea or vomiting. 10/02/20   Sharman Cheek, MD  promethazine (PHENERGAN) 25 MG tablet Take 1 tablet (25 mg total) by mouth every 8 (eight) hours as needed for nausea or vomiting. Patient not taking: Reported on 03/18/2021 08/23/19   Shaune Pollack, MD     Allergies  Patient has no known allergies.   Family History   Family History  Problem Relation Age of Onset   Diabetes Mother    Heart attack Mother    Crohn's disease Father    Hypertension Father      Physical Exam  Triage Vital Signs: ED  Triage Vitals  Enc Vitals Group     BP 09/14/22 2153 109/71     Pulse Rate 09/14/22 2153 (!) 112     Resp 09/14/22 2153 (!) 23     Temp 09/14/22 2153 98.8 F (37.1 C)     Temp Source 09/14/22 2153 Oral     SpO2 09/14/22 2153 94 %     Weight 09/14/22 2157 190 lb 0.6 oz (86.2 kg)     Height 09/14/22 2157 5\' 7"  (1.702 m)     Head Circumference --      Peak Flow --      Pain Score --      Pain Loc --      Pain Edu? --      Excl. in GC? --     Updated Vital Signs: BP 105/64 (BP Location: Left Arm)   Pulse 76   Temp 98.8 F (37.1 C) (Oral)   Resp 16   Ht 5\' 7"  (1.702 m)   Wt 86.2 kg   SpO2 96%   BMI 29.76 kg/m    General: Awake, no distress.  CV:  RRR.  Good peripheral perfusion.  Resp:  Normal effort.  CTAB. Abd:  Nontender.  No distention.  Other:  Somnolent but arousable to voice.  Follows simple commands. MAEx4.  Head is atraumatic.   ED Results / Procedures / Treatments  Labs (all labs ordered are listed, but only abnormal results are displayed) Labs Reviewed  COMPREHENSIVE METABOLIC PANEL - Abnormal; Notable for the following components:      Result Value   Chloride 112 (*)    Glucose, Bld 126 (*)    ALT 49 (*)    All other components within normal limits  URINALYSIS, ROUTINE W REFLEX MICROSCOPIC - Abnormal; Notable for the following components:   Color, Urine YELLOW (*)    APPearance CLEAR (*)    Specific Gravity, Urine 1.031 (*)    Protein, ur 30 (*)    All other components within normal limits  URINE DRUG SCREEN, QUALITATIVE ([facility] ONLY) - Abnormal; Notable for the following components:   Cannabinoid 50 Ng, [place] POSITIVE (*)    All other components within normal limits  ACETAMINOPHEN LEVEL - Abnormal; Notable for the following components:   Acetaminophen (Tylenol), Serum <10 (*)    All other components within normal limits  SALICYLATE LEVEL - Abnormal; Notable for the following components:   Salicylate Lvl <7.0 (*)    All other components within  normal limits  CBG MONITORING, ED - Abnormal; Notable for the following components:   Glucose-Capillary 132 (*)    All other components within normal limits  RESP PANEL BY RT-PCR (RSV, FLU A&B, COVID)  [id number]  CBC WITH DIFFERENTIAL/PLATELET  LACTIC ACID, PLASMA  PROTIME-INR  APTT  ETHANOL  TROPONIN I (HIGH SENSITIVITY)  EKG  ED ECG REPORT I, Elbie Statzer J, the attending physician, personally viewed and interpreted this ECG.   Date: 09/15/2022  EKG Time: 2200  Rate: 111  Rhythm: sinus tachycardia  Axis: Normal  Intervals:none  ST&T Change: Nonspecific    RADIOLOGY I have dependently visualized and interpreted patient's CT and chest x-Luke as well as noted the radiology interpretation:  CT head: No ICH  Chest x-Luke: Pulmonary hypoinflation  Official radiology report(s): DG Chest 1 View  Result Date: 09/14/2022 CLINICAL DATA:  Altered mental status EXAM: CHEST  1 VIEW COMPARISON:  03/18/2021 FINDINGS: Lungs volumes are small, but are symmetric. Mild bibasilar atelectasis. No pneumothorax or pleural effusion. Cardiac size within normal limits. Pulmonary vascularity is normal. Osseous structures are age-appropriate. No acute bone abnormality. IMPRESSION: 1. Pulmonary hypoinflation. Electronically Signed   By: Helyn Numbers M.D.   On: 09/14/2022 22:55   CT Head Wo Contrast  Result Date: 09/14/2022 CLINICAL DATA:  Mental status change, unknown cause EXAM: CT HEAD WITHOUT CONTRAST TECHNIQUE: Contiguous axial images were obtained from the base of the skull through the vertex without intravenous contrast. RADIATION DOSE REDUCTION: This exam was performed according to the departmental dose-optimization program which includes automated exposure control, adjustment of the mA and/or kV according to patient size and/or use of iterative reconstruction technique. COMPARISON:  None Available. FINDINGS: Brain: Normal anatomic configuration. No abnormal intra or extra-axial mass lesion or  fluid collection. No abnormal mass effect or midline shift. No evidence of acute intracranial hemorrhage or infarct. Ventricular size is normal. Cerebellum unremarkable. Vascular: Unremarkable Skull: Intact Sinuses/Orbits: Paranasal sinuses are clear. Orbits are unremarkable. Other: Mastoid air cells and middle ear cavities are clear. IMPRESSION: 1. No acute intracranial abnormality. Electronically Signed   By: Helyn Numbers M.D.   On: 09/14/2022 22:38     PROCEDURES:  Critical Care performed: No  .1-3 Lead EKG Interpretation  Performed by: Irean Hong, MD Authorized by: Irean Hong, MD     Interpretation: normal     ECG rate:  86   ECG rate assessment: normal     Rhythm: sinus rhythm     Ectopy: none     Conduction: normal   Comments:     Patient placed on cardiac monitor to evaluate for arrhythmias    MEDICATIONS ORDERED IN ED: Medications  sodium chloride 0.9 % bolus 1,000 mL (1,000 mLs Intravenous New Bag/Given 09/15/22 0357)     IMPRESSION / MDM / ASSESSMENT AND PLAN / ED COURSE  I reviewed the triage vital signs and the nursing notes.                             31 year old male presenting with altered mentation in the setting of eating two edibles. Differential diagnosis includes, but is not limited to, alcohol, illicit or prescription medications, or other toxic ingestion; intracranial pathology such as stroke or intracerebral hemorrhage; fever or infectious causes including sepsis; hypoxemia and/or hypercarbia; uremia; trauma; endocrine related disorders such as diabetes, hypoglycemia, and thyroid-related diseases; hypertensive encephalopathy; etc. I have personally reviewed patient's records and note he was seen in the ED on 08/24/2022 for influenza.  Patient's presentation is most consistent with acute presentation with potential threat to life or bodily function.  The patient is on the cardiac monitor to evaluate for evidence of arrhythmia and/or significant heart  rate changes.  Laboratory results demonstrate normal WBC, normal electrolytes, negative acetaminophen/salicylate, negative EtOH, normal troponin, negative lactic  acid and normal respiratory panel.  Will administer IV fluid hydration and continue to monitor and care for patient until sobriety.  Clinical Course as of 09/15/22 0653  Tue Sep 15, 2022  0653 Patient sleeping soundly in no acute distress.  Care will be transferred to the oncoming provider at change of shift.  Anticipate discharge home once patient is sober and ambulatory with steady gait. [JS]    Clinical Course User Index [JS] Irean Hong, MD     FINAL CLINICAL IMPRESSION(S) / ED DIAGNOSES   Final diagnoses:  Altered mental status, unspecified altered mental status type  Use of cannabinoid edibles     Rx / DC Orders   ED Discharge Orders     None        Note:  This document was prepared using Dragon voice recognition software and may include unintentional dictation errors.   Irean Hong, MD 09/15/22 [phone]

## 2022-09-15 NOTE — ED Provider Notes (Signed)
Patient signed out to me pending MTF.  On my assessment he is awake and alert says he is feeling improved.  He is appropriate for discharge at this time.   [name], MD 09/15/22 [phone]

## 2024-01-11 ENCOUNTER — Other Ambulatory Visit: Payer: Self-pay | Admitting: Family Medicine

## 2024-01-11 DIAGNOSIS — M439 Deforming dorsopathy, unspecified: Secondary | ICD-10-CM

## 2024-01-13 ENCOUNTER — Ambulatory Visit
Admission: RE | Admit: 2024-01-13 | Discharge: 2024-01-13 | Disposition: A | Discharge status: Home / Self Care | Payer: Self-pay | Source: Ambulatory Visit | Attending: Family Medicine | Admitting: Family Medicine

## 2024-01-13 DIAGNOSIS — M439 Deforming dorsopathy, unspecified: Secondary | ICD-10-CM

## 2024-01-19 ENCOUNTER — Other Ambulatory Visit: Payer: Self-pay | Admitting: Family Medicine

## 2024-01-19 DIAGNOSIS — M25552 Pain in left hip: Secondary | ICD-10-CM

## 2024-01-25 ENCOUNTER — Ambulatory Visit
Admission: RE | Admit: 2024-01-25 | Discharge: 2024-01-25 | Disposition: A | Discharge status: Home / Self Care | Source: Ambulatory Visit | Attending: Family Medicine | Admitting: Family Medicine

## 2024-01-25 DIAGNOSIS — M25552 Pain in left hip: Secondary | ICD-10-CM

## 2024-02-09 ENCOUNTER — Encounter: Payer: Self-pay | Admitting: Internal Medicine

## 2024-02-09 ENCOUNTER — Inpatient Hospital Stay: Attending: Internal Medicine | Admitting: Internal Medicine

## 2024-02-09 ENCOUNTER — Inpatient Hospital Stay

## 2024-02-09 VITALS — BP 116/75 | HR 60 | Temp 97.2°F | Resp 16 | Ht 67.0 in | Wt 207.5 lb

## 2024-02-09 DIAGNOSIS — F17210 Nicotine dependence, cigarettes, uncomplicated: Secondary | ICD-10-CM | POA: Diagnosis not present

## 2024-02-09 DIAGNOSIS — Z79899 Other long term (current) drug therapy: Secondary | ICD-10-CM | POA: Insufficient documentation

## 2024-02-09 DIAGNOSIS — R937 Abnormal findings on diagnostic imaging of other parts of musculoskeletal system: Secondary | ICD-10-CM | POA: Insufficient documentation

## 2024-02-09 DIAGNOSIS — Z808 Family history of malignant neoplasm of other organs or systems: Secondary | ICD-10-CM | POA: Diagnosis not present

## 2024-02-09 NOTE — Progress Notes (Signed)
 [facility] CONSULT NOTE  Patient Care Team: Nestor Banter, MD as PCP - General (Family Medicine) Gwyn Leos, MD as Consulting Physician (Oncology) Gwyn Leos, MD as Consulting Physician (Hematology and Oncology)  CHIEF COMPLAINTS/PURPOSE OF CONSULTATION: abnormal bone lesion.   HISTORY OF PRESENTING ILLNESS:  Luke Conway. 33 y.o.  male pleasant patient with a no significant past medical history has been referred to us  for an abnormal lesion noted on MRI.  Low back pain worsened in last 6 months. More pain left and right pain. Works at [employer]- 50- 100 pound boxes.  However no obvious trauma.  Patient had x-ray that was unremarkable.  Which led to further MRI that showed a possible benign lesion on the right trochanter/femur.  Patient c/o night sweats.-Which was attributed to drinking energy drinks 6-8 per day. Improved with cutting down on energy drink.     Review of Systems  Constitutional:  Positive for diaphoresis. Negative for chills, fever, malaise/fatigue and weight loss.  HENT:  Negative for nosebleeds and sore throat.   Eyes:  Negative for double vision.  Respiratory:  Negative for cough, hemoptysis, sputum production, shortness of breath and wheezing.   Cardiovascular:  Negative for chest pain, palpitations, orthopnea and leg swelling.  Gastrointestinal:  Negative for abdominal pain, blood in stool, constipation, diarrhea, heartburn, melena, nausea and vomiting.  Genitourinary:  Negative for dysuria, frequency and urgency.  Musculoskeletal:  Positive for back pain and joint pain.  Skin: Negative.  Negative for itching and rash.  Neurological:  Negative for dizziness, tingling, focal weakness, weakness and headaches.  Endo/Heme/Allergies:  Does not bruise/bleed easily.  Psychiatric/Behavioral:  Negative for depression. The patient is not nervous/anxious and does not have insomnia.     MEDICAL HISTORY:  Past Medical History:   Diagnosis Date   Anxiety    CVA (cerebral vascular accident) (HCC)    pt reports was "stress/anxiety event", not stroke   GERD (gastroesophageal reflux disease)    Hyperlipidemia     SURGICAL HISTORY: Past Surgical History:  Procedure Laterality Date   BIOPSY N/A 05/22/2019   Procedure: BIOPSY;  Surgeon: Marnee Sink, MD;  Location: [facility];  Service: Endoscopy;  Laterality: N/A;   COLONOSCOPY  2015   COLONOSCOPY WITH PROPOFOL  N/A 05/22/2019   Procedure: COLONOSCOPY WITH PROPOFOL ;  Surgeon: Marnee Sink, MD;  Location: [facility];  Service: Endoscopy;  Laterality: N/A;   POLYPECTOMY  05/22/2019   Procedure: POLYPECTOMY;  Surgeon: Marnee Sink, MD;  Location: [facility];  Service: Endoscopy;;    SOCIAL HISTORY: Social History   Socioeconomic History   Marital status: Divorced    Spouse name: Not on file   Number of children: Not on file   Years of education: Not on file   Highest education level: Not on file  Occupational History   Not on file  Tobacco Use   Smoking status: Some Days    Current packs/day: 0.00    Average packs/day: 0.5 packs/day for 10.0 years (5.0 ttl pk-yrs)    Types: Cigarettes    Start date: 07/28/2007    Last attempt to quit: 07/27/2017    Years since quitting: 6.5   Smokeless tobacco: Former    Types: Snuff, Chew   Tobacco comments:    since age 32  Vaping Use   Vaping status: Some Days   Substances: Nicotine, Flavoring  Substance and Sexual Activity   Alcohol use: Yes    Alcohol/week: 6.0 standard drinks of  alcohol    Types: 6 Cans of beer per week    Comment: 3-4 beers a week   Drug use: Not Currently   Sexual activity: Not on file  Other Topics Concern   Not on file  Social History Narrative   Not on file   Social Drivers of Health   Financial Resource Strain: Medium Risk (11/29/2023)   Received from [facility]   Overall Financial Resource Strain (CARDIA)    Difficulty of Paying Living  Expenses: Somewhat hard  Food Insecurity: Food Insecurity Present (11/29/2023)   Received from [facility]   Hunger Vital Sign    Worried About Running Out of Food in the Last Year: Sometimes true    Ran Out of Food in the Last Year: Sometimes true  Transportation Needs: Unmet Transportation Needs (11/29/2023)   Received from [facility] - Transportation    In the past 12 months, has lack of transportation kept you from medical appointments or from getting medications?: No    Lack of Transportation (Non-Medical): Yes  Physical Activity: Insufficiently Active (08/20/2017)   Exercise Vital Sign    Days of Exercise per Week: 2 days    Minutes of Exercise per Session: 20 min  Stress: No Stress Concern Present (08/20/2017)   [employer] of Occupational Health - Occupational Stress Questionnaire    Feeling of Stress : Not at all  Social Connections: Somewhat Isolated (08/20/2017)   Social Connection and Isolation Panel [NHANES]    Frequency of Communication with Friends and Family: More than three times a week    Frequency of Social Gatherings with Friends and Family: More than three times a week    Attends Religious Services: More than 4 times per year    Active Member of [employer] or Organizations: No    Attends Banker Meetings: Never    Marital Status: Divorced  Catering manager Violence: Not At Risk (08/20/2017)   Humiliation, Afraid, Rape, and Kick questionnaire    Fear of Current or Ex-Partner: No    Emotionally Abused: No    Physically Abused: No    Sexually Abused: No    FAMILY HISTORY: Family History  Problem Relation Age of Onset   Diabetes Mother    Heart attack Mother    Crohn's disease Father    Hypertension Father    Cancer Paternal Uncle        mouth   Esophageal cancer Maternal Grandfather     ALLERGIES:  has no known allergies.  MEDICATIONS:  Current Outpatient Medications  Medication Sig Dispense  Refill   cyclobenzaprine  (FLEXERIL ) 10 MG tablet Take 10 mg by mouth daily.     escitalopram (LEXAPRO) 20 MG tablet Take 20 mg by mouth daily.     esomeprazole (NEXIUM) 20 MG capsule Take 20 mg by mouth daily at 12 noon.     meloxicam (MOBIC) 15 MG tablet Take 10 mg by mouth daily.     No current facility-administered medications for this visit.    PHYSICAL EXAMINATION:   Vitals:   02/09/24 1111  BP: 116/75  Pulse: 60  Resp: 16  Temp: (!) 97.2 F (36.2 C)  SpO2: 98%   Filed Weights   02/09/24 1111  Weight: 207 lb 8 oz (94.1 kg)    Physical Exam Vitals and nursing note reviewed.  HENT:     Head: Normocephalic and atraumatic.     Mouth/Throat:     Pharynx: Oropharynx  is clear.  Eyes:     Extraocular Movements: Extraocular movements intact.     Pupils: Pupils are equal, round, and reactive to light.  Cardiovascular:     Rate and Rhythm: Normal rate and regular rhythm.  Pulmonary:     Comments: Decreased breath sounds bilaterally.  Abdominal:     Palpations: Abdomen is soft.  Musculoskeletal:        General: Normal range of motion.     Cervical back: Normal range of motion.  Skin:    General: Skin is warm.  Neurological:     General: No focal deficit present.     Mental Status: He is alert and oriented to person, place, and time.  Psychiatric:        Behavior: Behavior normal.        Judgment: Judgment normal.     LABORATORY DATA:  I have reviewed the data as listed Lab Results  Component Value Date   WBC 8.6 09/14/2022   HGB 14.6 09/14/2022   HCT 43.8 09/14/2022   MCV 81.0 09/14/2022   PLT 283 09/14/2022   No results for input(s): "NA", "K", "CL", "CO2", "GLUCOSE", "BUN", "CREATININE", "CALCIUM ", "GFRNONAA", "GFRAA", "PROT", "ALBUMIN", "AST", "ALT", "ALKPHOS", "BILITOT", "BILIDIR", "IBILI" in the last 8760 hours.  RADIOGRAPHIC STUDIES: I have personally reviewed the radiological images as listed and agreed with the findings in the report. MR HIP LEFT  WO CONTRAST Result Date: 01/31/2024 MR HIP WITHOUT IV CONTRAST LEFT COMPARISON: None. CLINICAL HISTORY: Low back pain and buttock pain. PULSE SEQUENCES: AX T1, Ax T2 FS, Cor T1, COR STIR & SMALL FOV COR PD FS without contrast. FINDINGS: Bones and labrum: No accelerated arthrosis, fracture or contusion is identified. No subchondral reactive edema or effusions. Mild increased signal is seen in the labrum without evidence of tear. The sacrum, SI joints and pelvis are remarkable. There is likely a benign chondroid lesion in the right intertrochanteric femur with partially. Musculotendinous structures: Minimal tendinosis of the origin of the hamstring. No significant myositis or significant tendinosis. No greater trochanteric bursal collection. Small left inguinal hernia containing fat. IMPRESSION: No accelerated arthrosis or acute abnormality. Mild tendinosis at the origin of the left hamstring. Likely benign chondroid lesion in the right intertrochanteric femur. Electronically signed by: Adrien Alberta MD 01/31/2024 11:44 AM EDT RP Workstation: [id number]   MR LUMBAR SPINE WO CONTRAST Result Date: 01/13/2024 CLINICAL DATA:  Low back pain radiating to both hips for 1 year with numbness. EXAM: MRI LUMBAR SPINE WITHOUT CONTRAST TECHNIQUE: Multiplanar, multisequence MR imaging of the lumbar spine was performed. No intravenous contrast was administered. COMPARISON:  None Available. FINDINGS: Segmentation:  Standard. Alignment:  Physiologic. Vertebrae: No fracture, evidence of discitis, or bone lesion. Superior endplate concavity at L2 and L3 could be developmental or from prior trauma, stable from 2019 abdominal CT with smooth symmetric appearance favoring developmental cause Conus medullaris and cauda equina: Conus extends to the L1 level. Conus and cauda equina appear normal. 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 No superimposed herniation or significant spurring. Diffusely patent foramina. Well preserved disc height and hydration. 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 No acquired spinal or foraminal stenosis, no nerve root compression. Electronically Signed   By: Ronnette Coke M.D.   On: 01/13/2024 07:58     Abnormal musculoskeletal diagnostic imaging MAY 12th, 2025- MRI  HIP: No accelerated arthrosis or acute abnormality; Mild tendinosis at the origin of the left hamstring; Likely benign chondroid lesion in the right intertrochanteric femur.  Currently awaiting orthopedic [facility]- appt- 30th.   # Prolonged discussion with patient and his fiance regarding most likely benign nature of the chondroid lesion.  This is unlikely because of patient's on going back pain or hip pain.  However we will review imaging at the cancer center conference next week.  And also if any surveillance scans are needed. MDT orders placed.   # Thank you Ms.Meeler for allowing me to participate in the care of your pleasant patient. Please do not hesitate to contact me with questions or concerns in the interim.  RN to call pt after the conference # DISPOSITION: # no labs-  # follow up TBD- Dr.B  # I reviewed the blood work- with the patient in detail; also reviewed the imaging independently [as summarized above]; and with the patient in detail.     Above plan of care was discussed with patient/family in detail.  My contact information was given to the patient/family.       Gwyn Leos, MD 02/09/2024 12:58 PM

## 2024-02-09 NOTE — Assessment & Plan Note (Addendum)
 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  Currently awaiting orthopedic [facility]- appt- 30th.   # Prolonged discussion with patient and his fiance regarding most likely benign nature of the chondroid lesion.  This is unlikely because of patient's on going back pain or hip pain.  However we will review imaging at the cancer center conference next week.  And also if any surveillance scans are needed. MDT orders placed.   # Thank you Ms.Meeler for allowing me to participate in the care of your pleasant patient. Please do not hesitate to contact me with questions or concerns in the interim.  RN to call pt after the conference # DISPOSITION: # no labs-  # follow up TBD- Dr.B  # I reviewed the blood work- with the patient in detail; also reviewed the imaging independently [as summarized above]; and with the patient in detail.
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
## 2024-02-17 ENCOUNTER — Other Ambulatory Visit

## 2024-02-18 ENCOUNTER — Telehealth: Payer: Self-pay | Admitting: Internal Medicine

## 2024-02-18 NOTE — Telephone Encounter (Signed)
#    We reviewed the imaging at the cancer conference- appeared benign, however, would not absolutely rule out any malignant process. Radiologist defered to orthopedics for further recommendations/follow up. Informed Luke Conway-PA-ortho [facility] apt on May 30th. I did discuss my recommendations with the patient. Patient has no further follow ups at the CancerCenter.
